# Patient Record
Sex: Female | Born: 1960 | Race: White | Hispanic: No | Smoking: Never smoker
Health system: Southern US, Community
[De-identification: ages and names within clinical notes are randomized; demographics above are authoritative.]

## PROBLEM LIST (undated history)

## (undated) DIAGNOSIS — F419 Anxiety disorder, unspecified: Secondary | ICD-10-CM

## (undated) DIAGNOSIS — F319 Bipolar disorder, unspecified: Secondary | ICD-10-CM

## (undated) DIAGNOSIS — M5432 Sciatica, left side: Secondary | ICD-10-CM

## (undated) HISTORY — PX: APPENDECTOMY: SHX54

## (undated) HISTORY — PX: WRIST FRACTURE SURGERY: SHX121

## (undated) HISTORY — PX: COLONOSCOPY: SHX174

## (undated) HISTORY — PX: TONSILLECTOMY: SUR1361

## (undated) HISTORY — DX: Anxiety disorder, unspecified: F41.9

## (undated) HISTORY — DX: Sciatica, left side: M54.32

## (undated) HISTORY — PX: HERNIA REPAIR: SHX51

---

## 1991-01-16 HISTORY — PX: TMJ ARTHROPLASTY: SHX1066

## 2006-01-15 HISTORY — PX: ABDOMINAL HYSTERECTOMY: SHX81

## 2014-01-12 ENCOUNTER — Ambulatory Visit: Payer: Self-pay | Admitting: Emergency Medicine

## 2014-01-12 LAB — RAPID STREP-A WITH REFLX: Micro Text Report: NEGATIVE

## 2014-01-15 LAB — BETA STREP CULTURE(ARMC)

## 2015-06-29 LAB — HM DEXA SCAN: HM DEXA SCAN: NORMAL

## 2015-08-02 LAB — COLOGUARD

## 2016-02-17 ENCOUNTER — Encounter: Payer: Self-pay | Admitting: Internal Medicine

## 2016-02-22 ENCOUNTER — Ambulatory Visit: Payer: Self-pay | Admitting: Internal Medicine

## 2016-08-06 ENCOUNTER — Encounter: Payer: Self-pay | Admitting: Emergency Medicine

## 2016-08-06 ENCOUNTER — Ambulatory Visit
Admission: EM | Admit: 2016-08-06 | Discharge: 2016-08-06 | Disposition: A | Discharge status: Home / Self Care | Payer: 59 | Attending: Family Medicine | Admitting: Family Medicine

## 2016-08-06 DIAGNOSIS — J0100 Acute maxillary sinusitis, unspecified: Secondary | ICD-10-CM | POA: Diagnosis not present

## 2016-08-06 DIAGNOSIS — R05 Cough: Secondary | ICD-10-CM

## 2016-08-06 DIAGNOSIS — R059 Cough, unspecified: Secondary | ICD-10-CM

## 2016-08-06 HISTORY — DX: Bipolar disorder, unspecified: F31.9

## 2016-08-06 MED ORDER — BENZONATATE 100 MG PO CAPS: 100.0000 mg | ORAL_CAPSULE | Freq: Three times a day (TID) | ORAL | 0 refills | Status: DC | PRN

## 2016-08-06 MED ORDER — AMOXICILLIN 875 MG PO TABS: 875.0000 mg | ORAL_TABLET | Freq: Two times a day (BID) | ORAL | 0 refills | Status: DC

## 2016-08-06 NOTE — ED Provider Notes (Signed)
[facility]    CSN: [id number] Arrival date & time: 08/06/16  1158     History   Chief Complaint Chief Complaint  Patient presents with  . Sinus Problem    HPI Jenna Estrada is a 56 y.o. female.   The history is provided by the patient.  Sinus Problem   URI  Presenting symptoms: congestion, facial pain and fatigue   Severity:  Moderate Onset quality:  Sudden Timing:  Constant Progression:  Worsening Chronicity:  New Relieved by:  Nothing Ineffective treatments:  OTC medications Associated symptoms: sinus pain   Risk factors: not elderly, no chronic cardiac disease, no chronic kidney disease, no chronic respiratory disease, no diabetes mellitus, no immunosuppression, no recent illness, no recent travel and no sick contacts     Past Medical History:  Diagnosis Date  . Bipolar 1 disorder (HCC)     There are no active problems to display for this patient.   Past Surgical History:  Procedure Laterality Date  . ABDOMINAL HYSTERECTOMY    . APPENDECTOMY    . HERNIA REPAIR    . TONSILLECTOMY      OB History    No data available       Home Medications    Prior to Admission medications   Medication Sig Start Date End Date Taking? Authorizing Provider  ARIPiprazole (ABILIFY) 2 MG tablet Take 2 mg by mouth daily.   Yes [provider]  buPROPion (WELLBUTRIN XL) 300 MG 24 hr tablet Take 300 mg by mouth daily.   Yes [provider]  DULoxetine (CYMBALTA) 60 MG capsule Take 60 mg by mouth daily.   Yes [provider]  LamoTRIgine 100 MG TBDP Take 200 mg by mouth daily.   Yes [provider]  amoxicillin (AMOXIL) 875 MG tablet Take 1 tablet (875 mg total) by mouth 2 (two) times daily. 08/06/16   Payton Mccallum, MD  benzonatate (TESSALON) 100 MG capsule Take 1 capsule (100 mg total) by mouth 3 (three) times daily as needed. 08/06/16   Payton Mccallum, MD    Family History History reviewed. No pertinent family  history.  Social History Social History  Substance Use Topics  . Smoking status: Never Smoker  . Smokeless tobacco: Never Used  . Alcohol use No     Allergies   Patient has no known allergies.   Review of Systems Review of Systems  Constitutional: Positive for fatigue.  HENT: Positive for congestion and sinus pain.      Physical Exam Triage Vital Signs ED Triage Vitals  Enc Vitals Group     BP 08/06/16 1215 134/85     Pulse Rate 08/06/16 1215 84     Resp 08/06/16 1215 16     Temp 08/06/16 1215 98.2 F (36.8 C)     Temp Source 08/06/16 1215 Oral     SpO2 08/06/16 1215 99 %     Weight 08/06/16 1214 151 lb (68.5 kg)     Height 08/06/16 1214 5\' 6"  (1.676 m)     Head Circumference --      Peak Flow --      Pain Score 08/06/16 1215 0     Pain Loc --      Pain Edu? --      Excl. in GC? --    No data found.   Updated Vital Signs BP 134/85 (BP Location: Left Arm)   Pulse 84   Temp 98.2 F (36.8 C) (Oral)   Resp  16   Ht 5\' 6"  (1.676 m)   Wt 151 lb (68.5 kg)   SpO2 99%   BMI 24.37 kg/m   Visual Acuity Right Eye Distance:   Left Eye Distance:   Bilateral Distance:    Right Eye Near:   Left Eye Near:    Bilateral Near:     Physical Exam  Constitutional: She appears well-developed and well-nourished. No distress.  HENT:  Head: Normocephalic and atraumatic.  Right Ear: Tympanic membrane, external ear and ear canal normal.  Left Ear: Tympanic membrane, external ear and ear canal normal.  Nose: Mucosal edema and rhinorrhea present. No nose lacerations, sinus tenderness, nasal deformity, septal deviation or nasal septal hematoma. No epistaxis.  No foreign bodies. Right sinus exhibits maxillary sinus tenderness and frontal sinus tenderness. Left sinus exhibits maxillary sinus tenderness and frontal sinus tenderness.  Mouth/Throat: Uvula is midline, oropharynx is clear and moist and mucous membranes are normal. No oropharyngeal exudate.  Eyes: Pupils are equal,  round, and reactive to light. Conjunctivae and EOM are normal. Right eye exhibits no discharge. Left eye exhibits no discharge. No scleral icterus.  Neck: Normal range of motion. Neck supple. No thyromegaly present.  Cardiovascular: Normal rate, regular rhythm and normal heart sounds.   Pulmonary/Chest: Effort normal and breath sounds normal. No respiratory distress. She has no wheezes. She has no rales.  Lymphadenopathy:    She has no cervical adenopathy.  Skin: She is not diaphoretic.  Nursing note and vitals reviewed.    UC Treatments / Results  Labs (all labs ordered are listed, but only abnormal results are displayed) Labs Reviewed - No data to display  EKG  EKG Interpretation None       Radiology No results found.  Procedures Procedures (including critical care time)  Medications Ordered in UC Medications - No data to display   Initial Impression / Assessment and Plan / UC Course  I have reviewed the triage vital signs and the nursing notes.  Pertinent labs & imaging results that were available during my care of the patient were reviewed by me and considered in my medical decision making (see chart for details).       Final Clinical Impressions(s) / UC Diagnoses   Final diagnoses:  Acute maxillary sinusitis, recurrence not specified  Cough    New Prescriptions New Prescriptions   AMOXICILLIN (AMOXIL) 875 MG TABLET    Take 1 tablet (875 mg total) by mouth 2 (two) times daily.   BENZONATATE (TESSALON) 100 MG CAPSULE    Take 1 capsule (100 mg total) by mouth 3 (three) times daily as needed.   1. diagnosis reviewed with patient 2. rx as per orders above; reviewed possible side effects, interactions, risks and benefits  3. Recommend supportive treatment with fluids, flonase 4. Follow-up prn if symptoms worsen or don't improve   Payton Mccallumonty, Portia Wisdom, MD 08/06/16 1242

## 2016-08-06 NOTE — ED Triage Notes (Signed)
[facility] [place] [facility] [place] [facility] [place].  Patient states that they did not give her antibiotic.  Patient reports that her symptoms have not improved.  Patient denies fevers.

## 2016-08-13 ENCOUNTER — Ambulatory Visit
Admission: EM | Admit: 2016-08-13 | Discharge: 2016-08-13 | Disposition: A | Discharge status: Home / Self Care | Payer: 59 | Attending: Family Medicine | Admitting: Family Medicine

## 2016-08-13 DIAGNOSIS — J0100 Acute maxillary sinusitis, unspecified: Secondary | ICD-10-CM | POA: Diagnosis not present

## 2016-08-13 DIAGNOSIS — R059 Cough, unspecified: Secondary | ICD-10-CM

## 2016-08-13 DIAGNOSIS — R05 Cough: Secondary | ICD-10-CM

## 2016-08-13 MED ORDER — HYDROCOD POLST-CPM POLST ER 10-8 MG/5ML PO SUER
5.0000 mL | Freq: Every evening | ORAL | 0 refills | Status: DC | PRN
Start: 2016-08-13 — End: 2016-09-26

## 2016-08-13 MED ORDER — BENZONATATE 200 MG PO CAPS: 200.0000 mg | ORAL_CAPSULE | Freq: Three times a day (TID) | ORAL | 0 refills | Status: DC | PRN

## 2016-08-13 NOTE — ED Provider Notes (Signed)
[facility] ____________________________________________  Time seen: Approximately 11:44 AM  I have reviewed the triage vital signs and the nursing notes.   HISTORY  Chief Complaint Cough   HPI Jenna Estrada is a 56 y.o. female  present for reevaluation of recent sinusitis and cough. Patient reports she was seen 1 week ago was treated with amoxicillin and Tessalon Perles as needed for cough and sinusitis. Patient denies fevers. States cough is worse at night with associated postnasal drainage. Reports first thing in the morning cough is somewhat productive of whitish mucus, reports otherwise cough is just a dry nonproductive cough. Reports continues his mucus when blowing nose but reports mucus has changed from green to white over the last several days while taking the antibiotic. States she does still has some maxillary sinus discomfort, but it has improved some. Reports 2 more days of antibiotic left. Denies associated ear complaints, sore throat, chest pain, shortness breath, wheezing or fevers. Reports has continued to remain active. Reports continues to eat and drink well. States overall she feels like the antibiotic has been working at improving her symptoms, but reports she still feels tired as she is not sleeping much with the cough. States that she has to go out of town tomorrow as her mother was recently placed on hospice. No over the counter medications taken. Denies aggravating or alleviating factors.   Denies chest pain, shortness of breath, hemoptysis, abdominal pain, dysuria, extremity pain, extremity swelling or rash. Denies other recent sickness. Denies cardiac history. Denies renal insufficiency.  Reubin MilanBerglund, Laura H, MD: PCP   Past Medical History:  Diagnosis Date  . Bipolar 1 disorder (HCC)     There are no active problems to display for this patient.   Past Surgical History:  Procedure Laterality Date  . ABDOMINAL HYSTERECTOMY    . APPENDECTOMY     . HERNIA REPAIR    . TONSILLECTOMY       No current facility-administered medications for this encounter.   Current Outpatient Prescriptions:  .  ARIPiprazole (ABILIFY) 2 MG tablet, Take 2 mg by mouth daily., Disp: , Rfl:  .  buPROPion (WELLBUTRIN XL) 300 MG 24 hr tablet, Take 300 mg by mouth daily., Disp: , Rfl:  .  DULoxetine (CYMBALTA) 60 MG capsule, Take 60 mg by mouth daily., Disp: , Rfl:  .  LamoTRIgine 100 MG TBDP, Take 200 mg by mouth daily., Disp: , Rfl:  .  amoxicillin (AMOXIL) 875 MG tablet, Take 1 tablet (875 mg total) by mouth 2 (two) times daily., Disp: 20 tablet, Rfl: 0 .  benzonatate (TESSALON) 200 MG capsule, Take 1 capsule (200 mg total) by mouth 3 (three) times daily as needed for cough., Disp: 20 capsule, Rfl: 0 .  chlorpheniramine-HYDROcodone (TUSSIONEX PENNKINETIC ER) 10-8 MG/5ML SUER, Take 5 mLs by mouth at bedtime as needed. do not drive or operate machinery while taking as can cause drowsiness., Disp: 75 mL, Rfl: 0  Allergies Patient has no known allergies.  History reviewed. No pertinent family history.  Social History Social History  Substance Use Topics  . Smoking status: Never Smoker  . Smokeless tobacco: Never Used  . Alcohol use No    Review of Systems Constitutional: No fever/chills ENT: No sore throat.  Cardiovascular: Denies chest pain. Respiratory: Denies shortness of breath. Gastrointestinal: No abdominal pain.  Genitourinary: Negative for dysuria. Musculoskeletal: Negative for back pain. Skin: Negative for rash.  ____________________________________________   PHYSICAL EXAM:  VITAL SIGNS: ED Triage Vitals  Enc Vitals Group  BP 08/13/16 1055 137/78     Pulse Rate 08/13/16 1055 79     Resp 08/13/16 1055 17     Temp 08/13/16 1055 98.4 F (36.9 C)     Temp Source 08/13/16 1055 Oral     SpO2 08/13/16 1055 98 %     Weight 08/13/16 1056 151 lb (68.5 kg)     Height 08/13/16 1056 5\' 6"  (1.676 m)     Head Circumference --       Peak Flow --      Pain Score 08/13/16 1056 5     Pain Loc --      Pain Edu? --      Excl. in GC? --     Constitutional: Alert and oriented. Well appearing and in no acute distress. Eyes: Conjunctivae are normal.  Head: Atraumatic.Mild tenderness to palpation bilateral frontal and maxillary sinuses. No swelling. No erythema.   Ears: no erythema, normal TMs bilaterally.   Nose: no nasal drainage or mucosal edema  Mouth/Throat: Mucous membranes are moist.  Oropharynx non-erythematous.No tonsillar swelling or exudate.  Neck: No stridor.  No cervical spine tenderness to palpation. Hematological/Lymphatic/Immunilogical: No cervical lymphadenopathy. Cardiovascular: Normal rate, regular rhythm. Grossly normal heart sounds.  Good peripheral circulation. Respiratory: Normal respiratory effort.  No retractions. No wheezes, rales or rhonchi. Good air movement.  Musculoskeletal:steady gait. No cervical, thoracic or lumbar tenderness to palpation.  Neurologic:  Normal speech and language. No gait instability. Skin:  Skin is warm, dry. Psychiatric: Mood and affect are normal. Speech and behavior are normal.  ___________________________________________   LABS (all labs ordered are listed, but only abnormal results are displayed)  Labs Reviewed - No data to display ____________________________________________   PROCEDURES Procedures   INITIAL IMPRESSION / ASSESSMENT AND PLAN / ED COURSE  Pertinent labs & imaging results that were available during my care of the patient were reviewed by me and considered in my medical decision making (see chart for details).   Well appearing patient. No acute distress.Presenting for follow-up of recent sinusitis and cough. Patient still taking amoxicillin and has 2 days left. Suspect improving sinusitis, with continued cough. Will treat supportively with Tessalon Perles and Tussionex. Patient states she only had a few Tessalon Perles left, will refill as well as  increased dose. When necessary Tussionex. Encourage rest, fluids and supportive care and over-the-counter Sudafed as needed. Discussed indication, risks and benefits of medications with patient.  Discussed follow up with Primary care physician this week. Discussed follow up and return parameters including no resolution or any worsening concerns. Patient verbalized understanding and agreed to plan.   ____________________________________________   FINAL CLINICAL IMPRESSION(S) / ED DIAGNOSES  Final diagnoses:  Acute maxillary sinusitis, recurrence not specified  Cough     Discharge Medication List as of 08/13/2016 11:38 AM    START taking these medications   Details  chlorpheniramine-HYDROcodone (TUSSIONEX PENNKINETIC ER) 10-8 MG/5ML SUER Take 5 mLs by mouth at bedtime as needed. do not drive or operate machinery while taking as can cause drowsiness., Starting Mon 08/13/2016, Print      tessalon perles  Note: This dictation was prepared with Dragon dictation along with smaller phrase technology. Any transcriptional errors that result from this process are unintentional.         Renford DillsMiller, Rishan Oyama, NP 08/13/16 1159

## 2016-08-13 NOTE — ED Triage Notes (Signed)
Patient complains of cough, congestion, weakness. Patient states that she was seen on 08/06/2016 and was treated with amoxicillin and tessalon perles. Patient states that she does not feel like she has fully improved. Patient reports that she is going out of town for a week and would like to feel better.

## 2016-08-13 NOTE — Discharge Instructions (Signed)
Take medication as prescribed. Rest. Drink plenty of fluids.  ° °Follow up with your primary care physician this week as needed. Return to Urgent care for new or worsening concerns.  ° °

## 2016-09-26 ENCOUNTER — Ambulatory Visit
Admission: EM | Admit: 2016-09-26 | Discharge: 2016-09-26 | Disposition: A | Discharge status: Home / Self Care | Payer: 59 | Attending: Family Medicine | Admitting: Family Medicine

## 2016-09-26 DIAGNOSIS — R059 Cough, unspecified: Secondary | ICD-10-CM

## 2016-09-26 DIAGNOSIS — R05 Cough: Secondary | ICD-10-CM

## 2016-09-26 DIAGNOSIS — J0100 Acute maxillary sinusitis, unspecified: Secondary | ICD-10-CM

## 2016-09-26 MED ORDER — BENZONATATE 100 MG PO CAPS: 100.0000 mg | ORAL_CAPSULE | Freq: Three times a day (TID) | ORAL | 0 refills | Status: DC | PRN

## 2016-09-26 MED ORDER — AMOXICILLIN 875 MG PO TABS: 875.0000 mg | ORAL_TABLET | Freq: Two times a day (BID) | ORAL | 0 refills | Status: DC

## 2016-09-26 MED ORDER — HYDROCOD POLST-CPM POLST ER 10-8 MG/5ML PO SUER: 5.0000 mL | Freq: Every evening | ORAL | 0 refills | Status: DC | PRN

## 2016-09-26 NOTE — ED Provider Notes (Signed)
[facility]    CSN: [id number] Arrival date & time: 09/26/16  1038     History   Chief Complaint Chief Complaint  Patient presents with  . Nasal Congestion  . Cough  . Headache    HPI Jenna Estrada is a 56 y.o. female.   The history is provided by the patient.  Cough  Associated symptoms: headaches   Associated symptoms: no wheezing   Headache  Associated symptoms: congestion, cough, facial pain and URI   URI  Presenting symptoms: congestion, cough and facial pain   Severity:  Moderate Onset quality:  Sudden Duration:  1 week Timing:  Constant Progression:  Worsening Chronicity:  New Relieved by:  Nothing Ineffective treatments:  OTC medications Associated symptoms: headaches and sinus pain   Associated symptoms: no wheezing   Risk factors: not elderly, no chronic cardiac disease, no chronic kidney disease, no chronic respiratory disease, no diabetes mellitus, no immunosuppression, no recent illness, no recent travel and no sick contacts     Past Medical History:  Diagnosis Date  . Bipolar 1 disorder (HCC)     There are no active problems to display for this patient.   Past Surgical History:  Procedure Laterality Date  . ABDOMINAL HYSTERECTOMY    . APPENDECTOMY    . HERNIA REPAIR    . TONSILLECTOMY      OB History    No data available       Home Medications    Prior to Admission medications   Medication Sig Start Date End Date Taking? Authorizing Provider  amoxicillin (AMOXIL) 875 MG tablet Take 1 tablet (875 mg total) by mouth 2 (two) times daily. 09/26/16   Payton Mccallum, MD  ARIPiprazole (ABILIFY) 2 MG tablet Take 2 mg by mouth daily.    [provider]  benzonatate (TESSALON) 100 MG capsule Take 1 capsule (100 mg total) by mouth 3 (three) times daily as needed. 09/26/16   Payton Mccallum, MD  buPROPion (WELLBUTRIN XL) 300 MG 24 hr tablet Take 300 mg by mouth daily.    [provider]    chlorpheniramine-HYDROcodone (TUSSIONEX PENNKINETIC ER) 10-8 MG/5ML SUER Take 5 mLs by mouth at bedtime as needed. 09/26/16   Payton Mccallum, MD  DULoxetine (CYMBALTA) 60 MG capsule Take 60 mg by mouth daily.    [provider]  LamoTRIgine 100 MG TBDP Take 200 mg by mouth daily.    [provider]    Family History No family history on file.  Social History Social History  Substance Use Topics  . Smoking status: Never Smoker  . Smokeless tobacco: Never Used  . Alcohol use No     Allergies   Patient has no known allergies.   Review of Systems Review of Systems  HENT: Positive for congestion and sinus pain.   Respiratory: Positive for cough. Negative for wheezing.   Neurological: Positive for headaches.     Physical Exam Triage Vital Signs ED Triage Vitals  Enc Vitals Group     BP 09/26/16 1147 119/76     Pulse Rate 09/26/16 1147 79     Resp 09/26/16 1147 16     Temp 09/26/16 1147 98.1 F (36.7 C)     Temp Source 09/26/16 1147 Oral     SpO2 09/26/16 1147 100 %     Weight 09/26/16 1147 151 lb (68.5 kg)     Height 09/26/16 1147  (1.676 m)     Head Circumference --  Peak Flow --      Pain Score 09/26/16 1150 8     Pain Loc --      Pain Edu? --      Excl. in GC? --    No data found.   Updated Vital Signs BP 119/76 (BP Location: Left Arm)   Pulse 79   Temp 98.1 F (36.7 C) (Oral)   Resp 16   Ht 5\' 6"  (1.676 m)   Wt 151 lb (68.5 kg)   SpO2 100%   BMI 24.37 kg/m   Visual Acuity Right Eye Distance:   Left Eye Distance:   Bilateral Distance:    Right Eye Near:   Left Eye Near:    Bilateral Near:     Physical Exam  Constitutional: She appears well-developed and well-nourished. No distress.  HENT:  Head: Normocephalic and atraumatic.  Right Ear: Tympanic membrane, external ear and ear canal normal.  Left Ear: Tympanic membrane, external ear and ear canal normal.  Nose: Mucosal edema and rhinorrhea present. No nose  lacerations, sinus tenderness, nasal deformity, septal deviation or nasal septal hematoma. No epistaxis.  No foreign bodies. Right sinus exhibits maxillary sinus tenderness and frontal sinus tenderness. Left sinus exhibits maxillary sinus tenderness and frontal sinus tenderness.  Mouth/Throat: Uvula is midline, oropharynx is clear and moist and mucous membranes are normal. No oropharyngeal exudate.  Eyes: Pupils are equal, round, and reactive to light. Conjunctivae and EOM are normal. Right eye exhibits no discharge. Left eye exhibits no discharge. No scleral icterus.  Neck: Normal range of motion. Neck supple. No thyromegaly present.  Cardiovascular: Normal rate, regular rhythm and normal heart sounds.   Pulmonary/Chest: Effort normal and breath sounds normal. No respiratory distress. She has no wheezes. She has no rales.  Lymphadenopathy:    She has no cervical adenopathy.  Skin: She is not diaphoretic.  Nursing note and vitals reviewed.    UC Treatments / Results  Labs (all labs ordered are listed, but only abnormal results are displayed) Labs Reviewed - No data to display  EKG  EKG Interpretation None       Radiology No results found.  Procedures Procedures (including critical care time)  Medications Ordered in UC Medications - No data to display   Initial Impression / Assessment and Plan / UC Course  I have reviewed the triage vital signs and the nursing notes.  Pertinent labs & imaging results that were available during my care of the patient were reviewed by me and considered in my medical decision making (see chart for details).       Final Clinical Impressions(s) / UC Diagnoses   Final diagnoses:  Acute maxillary sinusitis, recurrence not specified  Cough    New Prescriptions Discharge Medication List as of 09/26/2016 12:57 PM     1. diagnosis reviewed with patient 2. rx as per orders above; reviewed possible side effects, interactions, risks and  benefits  3. Recommend supportive treatment with otc flonase 4. Follow-up prn if symptoms worsen or don't improve  Controlled Substance Prescriptions [place] Controlled Substance Registry consulted? Not Applicable   Payton Mccallumonty, Waymond Meador, MD 09/26/16 1328

## 2016-09-26 NOTE — ED Triage Notes (Signed)
PT reports nasal congestion, cough, headache, facial pressure from sinuses x 3 days. Pt reports hx of sinus infection several months ago and this feels the same.

## 2016-10-30 ENCOUNTER — Ambulatory Visit: Payer: Self-pay | Admitting: Internal Medicine

## 2016-10-31 ENCOUNTER — Other Ambulatory Visit: Payer: Self-pay | Admitting: Internal Medicine

## 2016-10-31 ENCOUNTER — Encounter: Payer: Self-pay | Admitting: Internal Medicine

## 2016-10-31 ENCOUNTER — Ambulatory Visit (INDEPENDENT_AMBULATORY_CARE_PROVIDER_SITE_OTHER): Payer: 59 | Admitting: Internal Medicine

## 2016-10-31 VITALS — BP 116/70 | HR 69 | Ht 66.0 in | Wt 165.0 lb

## 2016-10-31 DIAGNOSIS — F319 Bipolar disorder, unspecified: Secondary | ICD-10-CM | POA: Diagnosis not present

## 2016-10-31 DIAGNOSIS — R05 Cough: Secondary | ICD-10-CM | POA: Insufficient documentation

## 2016-10-31 DIAGNOSIS — Z1239 Encounter for other screening for malignant neoplasm of breast: Secondary | ICD-10-CM

## 2016-10-31 DIAGNOSIS — R058 Other specified cough: Secondary | ICD-10-CM | POA: Insufficient documentation

## 2016-10-31 DIAGNOSIS — M5440 Lumbago with sciatica, unspecified side: Secondary | ICD-10-CM | POA: Diagnosis not present

## 2016-10-31 DIAGNOSIS — Z23 Encounter for immunization: Secondary | ICD-10-CM

## 2016-10-31 DIAGNOSIS — Z86010 Personal history of colonic polyps: Secondary | ICD-10-CM

## 2016-10-31 DIAGNOSIS — Z1231 Encounter for screening mammogram for malignant neoplasm of breast: Secondary | ICD-10-CM

## 2016-10-31 DIAGNOSIS — G8929 Other chronic pain: Secondary | ICD-10-CM

## 2016-10-31 MED ORDER — GABAPENTIN 300 MG PO CAPS: 300.0000 mg | ORAL_CAPSULE | Freq: Two times a day (BID) | ORAL | 5 refills | Status: DC

## 2016-10-31 NOTE — Patient Instructions (Signed)
Non sedating antihistamine such as Allegra, Claritin or Zyrtec every evening for 1-2 weeks to help reduce cough.  Can continue indefinitely if it is helpful.

## 2016-10-31 NOTE — Progress Notes (Signed)
Date:  10/31/2016   Name:  Jenna Estrada   DOB:  11/11/1960   MRN:  [id number]   Chief Complaint: Establish Care (Moved here from [place] in december of last year. See's Gene [facility] at [facility] for most medication. ); Sciatica (Gabpentin refill. ); and Cough (X 4 months. When father was dx withed rectal cancer his first sign was cough. Wants chest xray to be sure everything is okay. Last lab work was in May and that was fine. )  Cough  This is a chronic problem. The current episode started more than 1 month ago (4 months). The problem has been unchanged. The cough is non-productive. Associated symptoms include postnasal drip. Pertinent negatives include no chest pain, chills, headaches, sore throat, shortness of breath or wheezing. There is no history of environmental allergies.  Back Pain  This is a chronic problem. The problem is unchanged. The pain is present in the lumbar spine. The pain radiates to the left foot. Pertinent negatives include no abdominal pain, chest pain or headaches. She has tried home exercises (gabapentin) for the symptoms. The treatment provided significant relief.   Bipolar I - doing well on current medications and followed by Psych.  No recent issues or problems.  No medication side effects noted.    Review of Systems  Constitutional: Negative for chills, fatigue and unexpected weight change.  HENT: Positive for postnasal drip. Negative for congestion, sore throat and trouble swallowing.   Eyes: Negative for visual disturbance.  Respiratory: Positive for cough. Negative for chest tightness, shortness of breath and wheezing.   Cardiovascular: Negative for chest pain, palpitations and leg swelling.  Gastrointestinal: Negative for abdominal pain, constipation and diarrhea.  Musculoskeletal: Positive for back pain.  Allergic/Immunologic: Negative for environmental allergies and food allergies.  Neurological: Negative for dizziness and headaches.    Psychiatric/Behavioral: Negative for dysphoric mood. The patient is not nervous/anxious.     There are no active problems to display for this patient.   Prior to Admission medications   Medication Sig Start Date End Date Taking? Authorizing Provider  ARIPiprazole (ABILIFY) 2 MG tablet Take 2.5 mg by mouth daily.    Yes [provider]  buPROPion (WELLBUTRIN XL) 300 MG 24 hr tablet Take 300 mg by mouth daily.   Yes [provider]  DULoxetine (CYMBALTA) 60 MG capsule Take 60 mg by mouth daily.   Yes [provider]  gabapentin (NEURONTIN) 300 MG capsule Take 300 mg by mouth 2 (two) times daily. 09/21/16  Yes [provider]  LamoTRIgine 100 MG TBDP Take 200 mg by mouth daily.   Yes [provider]    No Known Allergies  Past Surgical History:  Procedure Laterality Date  . ABDOMINAL HYSTERECTOMY     still have ovaries  . APPENDECTOMY     1971  . COLONOSCOPY     Every 5 years.   Marland Kitchen. HERNIA REPAIR    . 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 TONSILLECTOMY    . WRIST FRACTURE SURGERY     plate put in right wrist     Social History  Substance Use Topics  . Smoking status: Never Smoker  . Smokeless tobacco: Never Used  . Alcohol use No     Medication list has been reviewed and updated.  PHQ 2/9 Scores 10/31/2016  PHQ - 2 Score 1    Physical Exam  Constitutional: She is oriented to person, place, and time. She appears  well-developed. No distress.  HENT:  Head: Normocephalic and atraumatic.  Right Ear: Tympanic membrane and ear canal normal.  Left Ear: Tympanic membrane and ear canal normal.  Nose: Right sinus exhibits no maxillary sinus tenderness. Left sinus exhibits no maxillary sinus tenderness.  Mouth/Throat: No oropharyngeal exudate or posterior oropharyngeal erythema.  Neck: Normal range of motion. Neck supple. No thyromegaly present.  Cardiovascular: Normal rate, regular rhythm and normal heart sounds.    Pulmonary/Chest: Effort normal and breath sounds normal. No respiratory distress. She has no wheezes. She has no rales.  Abdominal: Soft. Normal appearance and bowel sounds are normal. There is no tenderness. There is no CVA tenderness.  Musculoskeletal: Normal range of motion.  Lymphadenopathy:       Head (right side): Submandibular (small lymph node) adenopathy present.  Neurological: She is alert and oriented to person, place, and time.  Skin: Skin is warm and dry. No rash noted.  Psychiatric: She has a normal mood and affect. Her speech is normal and behavior is normal. Thought content normal.  Nursing note and vitals reviewed.   BP 116/70   Pulse 69   Ht 5\' 6"  (1.676 m)   Wt 165 lb (74.8 kg)   SpO2 99%   BMI 26.63 kg/m   Assessment and Plan: 1. Bipolar 1 disorder (HCC) Stable on current medications Followed by Psych  2. Chronic low back pain with sciatica, sciatica laterality unspecified, unspecified back pain laterality Doing well with gabapentin for mild low back pain - gabapentin (NEURONTIN) 300 MG capsule; Take 1 capsule (300 mg total) by mouth 2 (two) times daily.  Dispense: 60 capsule; Refill: 5  3. Allergic cough Trial of antihistamine recommended  4. Breast cancer screening - MM DIGITAL SCREENING BILATERAL; Future  5. Need for influenza vaccination - Flu Vaccine QUAD 36+ mos IM   Meds ordered this encounter  Medications  . gabapentin (NEURONTIN) 300 MG capsule    Sig: Take 1 capsule (300 mg total) by mouth 2 (two) times daily.    Dispense:  60 capsule    Refill:  5    Partially dictated using Animal nutritionist. Any errors are unintentional. Will request records from previous MD to update records here  Bari Edward, MD [facility]  10/31/2016

## 2016-11-05 ENCOUNTER — Encounter: Payer: Self-pay | Admitting: Internal Medicine

## 2016-11-05 ENCOUNTER — Telehealth: Payer: Self-pay | Admitting: Internal Medicine

## 2016-11-05 NOTE — Telephone Encounter (Signed)
Please call patient to let her know that I have the colonoscopy report from 06/2015.  They found 2 polyps but the path report was empty of polyp type. The doctor recommended in his note to do a Cologuard since the prep was poor.  Ask her if she did, indeed, do a cologuard test last year. He recommended a repeat colonoscopy in 3 years (2020) if Cologuard was negative.

## 2016-11-06 NOTE — Telephone Encounter (Signed)
Patient stated she had Cologuard test and it turned out normal/negative. Will get previous doctor to send report to our office in FAX.

## 2016-11-12 ENCOUNTER — Inpatient Hospital Stay
Admission: RE | Admit: 2016-11-12 | Discharge: 2016-11-12 | Disposition: A | Discharge status: Home / Self Care | Payer: Self-pay | Source: Ambulatory Visit | Attending: *Deleted | Admitting: *Deleted

## 2016-11-12 ENCOUNTER — Other Ambulatory Visit: Payer: Self-pay | Admitting: *Deleted

## 2016-11-12 DIAGNOSIS — Z9289 Personal history of other medical treatment: Secondary | ICD-10-CM

## 2016-11-28 ENCOUNTER — Ambulatory Visit: Payer: 59

## 2016-12-05 ENCOUNTER — Ambulatory Visit: Payer: 59

## 2016-12-18 ENCOUNTER — Ambulatory Visit
Admission: RE | Admit: 2016-12-18 | Discharge: 2016-12-18 | Disposition: A | Discharge status: Home / Self Care | Payer: 59 | Source: Ambulatory Visit | Attending: Internal Medicine | Admitting: Internal Medicine

## 2016-12-18 DIAGNOSIS — Z1231 Encounter for screening mammogram for malignant neoplasm of breast: Secondary | ICD-10-CM | POA: Diagnosis present

## 2016-12-18 DIAGNOSIS — Z1239 Encounter for other screening for malignant neoplasm of breast: Secondary | ICD-10-CM

## 2016-12-21 ENCOUNTER — Ambulatory Visit
Admission: EM | Admit: 2016-12-21 | Discharge: 2016-12-21 | Disposition: A | Discharge status: Home / Self Care | Payer: 59 | Attending: Family Medicine | Admitting: Family Medicine

## 2016-12-21 ENCOUNTER — Encounter: Payer: Self-pay | Admitting: Emergency Medicine

## 2016-12-21 ENCOUNTER — Other Ambulatory Visit: Payer: Self-pay

## 2016-12-21 DIAGNOSIS — J0100 Acute maxillary sinusitis, unspecified: Secondary | ICD-10-CM | POA: Diagnosis not present

## 2016-12-21 MED ORDER — AMOXICILLIN 875 MG PO TABS: 875.0000 mg | ORAL_TABLET | Freq: Two times a day (BID) | ORAL | 0 refills | Status: DC

## 2016-12-21 MED ORDER — GUAIFENESIN-CODEINE 100-10 MG/5ML PO SOLN: ORAL | 0 refills | Status: DC

## 2016-12-21 NOTE — ED Triage Notes (Signed)
Patient c/o sinus pressure and congestion a week ago.  Patient denies fevers.

## 2016-12-21 NOTE — ED Provider Notes (Signed)
[facility]    CSN: [id number] Arrival date & time: 12/21/16  1037     History   Chief Complaint Chief Complaint  Patient presents with  . Sinus Problem    HPI Jenna Estrada is a 56 y.o. female.   The history is provided by the patient.  Sinus Problem  Associated symptoms include headaches.  URI  Presenting symptoms: congestion and facial pain   Severity:  Moderate Onset quality:  Sudden Duration:  1 week Timing:  Constant Progression:  Worsening Chronicity:  New Relieved by:  Nothing Ineffective treatments:  OTC medications Associated symptoms: headaches and sinus pain   Associated symptoms: no wheezing   Risk factors: sick contacts   Risk factors: not elderly, no chronic cardiac disease, no chronic kidney disease, no chronic respiratory disease, no diabetes mellitus, no immunosuppression, no recent illness and no recent travel     Past Medical History:  Diagnosis Date  . Anxiety   . Bipolar 1 disorder (HCC)   . Sciatic nerve pain, left    taking gabapentin.    Patient Active Problem List   Diagnosis Date Noted  . Bipolar 1 disorder (HCC) 10/31/2016  . Chronic low back pain with sciatica 10/31/2016  . Allergic cough 10/31/2016  . Hx of colonic polyps 10/31/2016    Past Surgical History:  Procedure Laterality Date  . ABDOMINAL HYSTERECTOMY  2008   fibroids, ovaries remain  . APPENDECTOMY     1971  . COLONOSCOPY  10/2014 and 06/17/2015   Every 3 yr due to incomplete prep  . HERNIA REPAIR    . 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 TONSILLECTOMY    . WRIST FRACTURE SURGERY     plate put in right wrist     OB History    No data available       Home Medications    Prior to Admission medications   Medication Sig Start Date End Date Taking? Authorizing Provider  ARIPiprazole (ABILIFY) 2 MG tablet Take 2.5 mg by mouth daily.    Yes [provider]  buPROPion (WELLBUTRIN XL) 300 MG 24 hr tablet Take 300 mg by  mouth daily.   Yes [provider]  DULoxetine (CYMBALTA) 60 MG capsule Take 60 mg by mouth daily.   Yes [provider]  gabapentin (NEURONTIN) 300 MG capsule Take 1 capsule (300 mg total) by mouth 2 (two) times daily. 10/31/16  Yes Reubin MilanBerglund, Laura H, MD  LamoTRIgine 100 MG TBDP Take 200 mg by mouth daily.   Yes [provider]  amoxicillin (AMOXIL) 875 MG tablet Take 1 tablet (875 mg total) by mouth 2 (two) times daily. 12/21/16   Payton Mccallumonty, Neiko Trivedi, MD  guaiFENesin-codeine 100-10 MG/5ML syrup 10 ml po qhs prn cough 12/21/16   Payton Mccallumonty, Jackelynn Hosie, MD    Family History Family History  Problem Relation Age of Onset  . Hypertension Mother   . Rectal cancer Father   . COPD Father   . Asthma Father   . Breast cancer Neg Hx     Social History Social History   Tobacco Use  . Smoking status: Never Smoker  . Smokeless tobacco: Never Used  Substance Use Topics  . Alcohol use: No  . Drug use: No     Allergies   Patient has no known allergies.   Review of Systems Review of Systems  HENT: Positive for congestion and sinus pain.   Respiratory: Negative for wheezing.   Neurological: Positive  for headaches.     Physical Exam Triage Vital Signs ED Triage Vitals  Enc Vitals Group     BP 12/21/16 1051 (!) 147/88     Pulse Rate 12/21/16 1051 80     Resp 12/21/16 1051 16     Temp 12/21/16 1051 98.1 F (36.7 C)     Temp Source 12/21/16 1051 Oral     SpO2 12/21/16 1051 99 %     Weight 12/21/16 1050 160 lb (72.6 kg)     Height 12/21/16 1050 5\' 6"  (1.676 m)     Head Circumference --      Peak Flow --      Pain Score 12/21/16 1050 4     Pain Loc --      Pain Edu? --      Excl. in GC? --    No data found.  Updated Vital Signs BP (!) 147/88 (BP Location: Left Arm)   Pulse 80   Temp 98.1 F (36.7 C) (Oral)   Resp 16   Ht 5\' 6"  (1.676 m)   Wt 160 lb (72.6 kg)   SpO2 99%   BMI 25.82 kg/m   Visual Acuity Right Eye Distance:   Left Eye Distance:     Bilateral Distance:    Right Eye Near:   Left Eye Near:    Bilateral Near:     Physical Exam  Constitutional: She appears well-developed and well-nourished. No distress.  HENT:  Head: Normocephalic and atraumatic.  Right Ear: Tympanic membrane, external ear and ear canal normal.  Left Ear: Tympanic membrane, external ear and ear canal normal.  Nose: Mucosal edema and rhinorrhea present. No nose lacerations, sinus tenderness, nasal deformity, septal deviation or nasal septal hematoma. No epistaxis.  No foreign bodies. Right sinus exhibits maxillary sinus tenderness and frontal sinus tenderness. Left sinus exhibits maxillary sinus tenderness and frontal sinus tenderness.  Mouth/Throat: Uvula is midline, oropharynx is clear and moist and mucous membranes are normal. No oropharyngeal exudate.  Eyes: Conjunctivae and EOM are normal. Pupils are equal, round, and reactive to light. Right eye exhibits no discharge. Left eye exhibits no discharge. No scleral icterus.  Neck: Normal range of motion. Neck supple. No thyromegaly present.  Cardiovascular: Normal rate, regular rhythm and normal heart sounds.  Pulmonary/Chest: Effort normal and breath sounds normal. No respiratory distress. She has no wheezes. She has no rales.  Lymphadenopathy:    She has no cervical adenopathy.  Skin: She is not diaphoretic.  Nursing note and vitals reviewed.    UC Treatments / Results  Labs (all labs ordered are listed, but only abnormal results are displayed) Labs Reviewed - No data to display  EKG  EKG Interpretation None       Radiology No results found.  Procedures Procedures (including critical care time)  Medications Ordered in UC Medications - No data to display   Initial Impression / Assessment and Plan / UC Course  I have reviewed the triage vital signs and the nursing notes.  Pertinent labs & imaging results that were available during my care of the patient were reviewed by me and  considered in my medical decision making (see chart for details).       Final Clinical Impressions(s) / UC Diagnoses   Final diagnoses:  Acute maxillary sinusitis, recurrence not specified    ED Discharge Orders        Ordered    guaiFENesin-codeine 100-10 MG/5ML syrup     12/21/16 1113  amoxicillin (AMOXIL) 875 MG tablet  2 times daily     12/21/16 1113     1. Labs/x-ray results and diagnosis reviewed with patient 2. rx as per orders above; reviewed possible side effects, interactions, risks and benefits  3. Recommend supportive treatment with rest, otc flonase 4. Follow-up prn if symptoms worsen or don't improve   Controlled Substance Prescriptions [place] Controlled Substance Registry consulted? Not Applicable   Payton Mccallum, MD 12/21/16 1124

## 2016-12-25 ENCOUNTER — Other Ambulatory Visit: Payer: Self-pay

## 2016-12-25 ENCOUNTER — Encounter: Payer: Self-pay | Admitting: Emergency Medicine

## 2016-12-25 ENCOUNTER — Ambulatory Visit
Admission: EM | Admit: 2016-12-25 | Discharge: 2016-12-25 | Disposition: A | Discharge status: Home / Self Care | Payer: 59 | Attending: Family Medicine | Admitting: Family Medicine

## 2016-12-25 DIAGNOSIS — R05 Cough: Secondary | ICD-10-CM | POA: Diagnosis not present

## 2016-12-25 DIAGNOSIS — R059 Cough, unspecified: Secondary | ICD-10-CM

## 2016-12-25 MED ORDER — HYDROCOD POLST-CPM POLST ER 10-8 MG/5ML PO SUER: 5.0000 mL | Freq: Every evening | ORAL | 0 refills | Status: DC | PRN

## 2016-12-25 NOTE — ED Triage Notes (Signed)
Patient was seen 12/21/16 for cough and was prescribed cough medicine that is not working. Patient has used Tussinex in the past and it has work [employer].

## 2016-12-25 NOTE — ED Provider Notes (Signed)
[facility]    CSN: [id number] Arrival date & time: 12/25/16  1745  History   Chief Complaint Chief Complaint  Patient presents with  . Cough   HPI  56 year old female presents with the above complaint.  Patient was recently seen on 12/7.  She was diagnosed with sinusitis and was given an antibiotic and cough medication.  She states that the cough medication is not working.  She still has severe cough at night.  Interfering with sleep.  No shortness of breath.  No fever.  She states that she is otherwise feeling well.  She is requesting an alternative medication for cough.  She is requesting Tussionex.  Past Medical History:  Diagnosis Date  . Anxiety   . Bipolar 1 disorder (HCC)   . Sciatic nerve pain, left    taking gabapentin.    Patient Active Problem List   Diagnosis Date Noted  . Bipolar 1 disorder (HCC) 10/31/2016  . Chronic low back pain with sciatica 10/31/2016  . Allergic cough 10/31/2016  . Hx of colonic polyps 10/31/2016    Past Surgical History:  Procedure Laterality Date  . ABDOMINAL HYSTERECTOMY  2008   fibroids, ovaries remain  . APPENDECTOMY     1971  . COLONOSCOPY  10/2014 and 06/17/2015   Every 3 yr due to incomplete prep  . HERNIA REPAIR    . 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 TONSILLECTOMY    . WRIST FRACTURE SURGERY     plate put in right wrist     OB History    No data available       Home Medications    Prior to Admission medications   Medication Sig Start Date End Date Taking? Authorizing Provider  amoxicillin (AMOXIL) 875 MG tablet Take 1 tablet (875 mg total) by mouth 2 (two) times daily. 12/21/16  Yes Payton Mccallumonty, [place], MD  ARIPiprazole (ABILIFY) 2 MG tablet Take 2.5 mg by mouth daily.    Yes [provider]  buPROPion (WELLBUTRIN XL) 300 MG 24 hr tablet Take 300 mg by mouth daily.   Yes [provider]  DULoxetine (CYMBALTA) 60 MG capsule Take 60 mg by mouth daily.   Yes [provider]  gabapentin (NEURONTIN) 300 MG capsule Take 1 capsule (300 mg total) by mouth 2 (two) times daily. 10/31/16  Yes Reubin MilanBerglund, Laura H, MD  LamoTRIgine 100 MG TBDP Take 200 mg by mouth daily.   Yes [provider]  chlorpheniramine-HYDROcodone (TUSSIONEX PENNKINETIC ER) 10-8 MG/5ML SUER Take 5 mLs by mouth at bedtime as needed. 12/25/16   Tommie Samsook, Deshay Kirstein G, DO    Family History Family History  Problem Relation Age of Onset  . Hypertension Mother   . Rectal cancer Father   . COPD Father   . Asthma Father   . Breast cancer Neg Hx     Social History Social History   Tobacco Use  . Smoking status: Never Smoker  . Smokeless tobacco: Never Used  Substance Use Topics  . Alcohol use: No  . Drug use: No     Allergies   Patient has no known allergies.   Review of Systems Review of Systems  Constitutional: Negative.   Respiratory: Positive for cough. Negative for shortness of breath.    Physical Exam Triage Vital Signs ED Triage Vitals  Enc Vitals Group     BP 12/25/16 1757 (!) 149/89     Pulse Rate 12/25/16 1757 82  Resp 12/25/16 1757 16     Temp 12/25/16 1757 98 F (36.7 C)     Temp Source 12/25/16 1757 Oral     SpO2 12/25/16 1757 100 %     Weight 12/25/16 1757 160 lb (72.6 kg)     Height 12/25/16 1757 5\' 6"  (1.676 m)     Head Circumference --      Peak Flow --      Pain Score 12/25/16 1758 0     Pain Loc --      Pain Edu? --      Excl. in GC? --    Updated Vital Signs BP (!) 149/89 (BP Location: Left Arm)   Pulse 82   Temp 98 F (36.7 C) (Oral)   Resp 16   Ht 5\' 6"  (1.676 m)   Wt 160 lb (72.6 kg)   SpO2 100%   BMI 25.82 kg/m   Visual Acuity Right Eye Distance:   Left Eye Distance:   Bilateral Distance:    Right Eye Near:   Left Eye Near:    Bilateral Near:     Physical Exam  Constitutional: She is oriented to person, place, and time. She appears well-developed. No distress.  HENT:  Head: Normocephalic and atraumatic.  Mouth/Throat:  Oropharynx is clear and moist.  Cardiovascular: Normal rate and regular rhythm.  No murmur heard. Pulmonary/Chest: Effort normal and breath sounds normal. She has no wheezes. She has no rales.  Neurological: She is alert and oriented to person, place, and time.  Psychiatric: She has a normal mood and affect. Her behavior is normal.  Vitals reviewed.  UC Treatments / Results  Labs (all labs ordered are listed, but only abnormal results are displayed) Labs Reviewed - No data to display  EKG  EKG Interpretation None       Radiology No results found.  Procedures Procedures (including critical care time)  Medications Ordered in UC Medications - No data to display   Initial Impression / Assessment and Plan / UC Course  I have reviewed the triage vital signs and the nursing notes.  Pertinent labs & imaging results that were available during my care of the patient were reviewed by me and considered in my medical decision making (see chart for details).     56 year old female presents with cough. Stopping guaifenesin with codeine.  Starting Tussionex.  Of note, after the visit the [place] database was checked given the fact that she just received a prescription.  He revealed that she has had 4 prescriptions for cough medication in the past 4 months.  This is slightly concerning.  Recommend that if patient returns requesting prescription that this not be given.  Final Clinical Impressions(s) / UC Diagnoses   Final diagnoses:  Cough    ED Discharge Orders        Ordered    chlorpheniramine-HYDROcodone (TUSSIONEX PENNKINETIC ER) 10-8 MG/5ML SUER  At bedtime PRN     12/25/16 1855     Controlled Substance Prescriptions [place] Controlled Substance Registry consulted? Yes. See above.    Tommie SamsCook, Sabatino Williard G, [place]DO 12/25/16 1920

## 2016-12-25 NOTE — Discharge Instructions (Signed)
Take care  Dr. [name]

## 2017-01-14 ENCOUNTER — Encounter: Payer: Self-pay | Admitting: Internal Medicine

## 2017-01-14 ENCOUNTER — Ambulatory Visit (INDEPENDENT_AMBULATORY_CARE_PROVIDER_SITE_OTHER): Payer: 59 | Admitting: Internal Medicine

## 2017-01-14 ENCOUNTER — Other Ambulatory Visit: Payer: Self-pay | Admitting: Internal Medicine

## 2017-01-14 VITALS — BP 132/78 | HR 82 | Temp 98.3°F | Ht 66.0 in | Wt 172.0 lb

## 2017-01-14 DIAGNOSIS — J40 Bronchitis, not specified as acute or chronic: Secondary | ICD-10-CM

## 2017-01-14 MED ORDER — CEFDINIR 300 MG PO CAPS: 300.0000 mg | ORAL_CAPSULE | Freq: Two times a day (BID) | ORAL | 0 refills | Status: AC

## 2017-01-14 MED ORDER — BENZONATATE 100 MG PO CAPS: 100.0000 mg | ORAL_CAPSULE | Freq: Three times a day (TID) | ORAL | 0 refills | Status: DC

## 2017-01-14 MED ORDER — HYDROCOD POLST-CPM POLST ER 10-8 MG/5ML PO SUER: 5.0000 mL | Freq: Two times a day (BID) | ORAL | 0 refills | Status: DC

## 2017-01-14 NOTE — Progress Notes (Signed)
Date:  01/14/2017   Name:  Jenna Estrada   DOB:  12/10/1960   MRN:  [id number]   Chief Complaint: Cough (Started 3 weeks ago- thick green/ yellow mucous. Seen UC on 7th. Was give cough syrup and anitbitoics. Been about a week since off antibiotics and 3-4 days since been off cough meds. Coughing all night long since off meds.   )  Cough  This is a recurrent problem. The current episode started 1 to 4 weeks ago. The problem has been gradually worsening. The problem occurs hourly. The cough is productive of sputum. Associated symptoms include postnasal drip, shortness of breath and wheezing. Pertinent negatives include no chest pain, chills, fever or headaches. The symptoms are aggravated by lying down. She has tried prescription cough suppressant (and amoxicillin) for the symptoms.    Review of Systems  Constitutional: Negative for chills, fatigue and fever.  HENT: Positive for congestion and postnasal drip.   Eyes: Negative for visual disturbance.  Respiratory: Positive for cough, shortness of breath and wheezing. Negative for chest tightness.   Cardiovascular: Negative for chest pain and palpitations.  Gastrointestinal: Negative for abdominal pain, diarrhea and vomiting.  Neurological: Negative for dizziness, numbness and headaches.    Patient Active Problem List   Diagnosis Date Noted  . Bipolar 1 disorder (HCC) 10/31/2016  . Chronic low back pain with sciatica 10/31/2016  . Allergic cough 10/31/2016  . Hx of colonic polyps 10/31/2016    Prior to Admission medications   Medication Sig Start Date End Date Taking? Authorizing Provider  ARIPiprazole (ABILIFY) 2 MG tablet Take 2.5 mg by mouth daily.    Yes [provider]  buPROPion (WELLBUTRIN XL) 300 MG 24 hr tablet Take 300 mg by mouth daily.   Yes [provider]  DULoxetine (CYMBALTA) 60 MG capsule Take 60 mg by mouth daily.   Yes [provider]  gabapentin (NEURONTIN) 300 MG capsule Take 1  capsule (300 mg total) by mouth 2 (two) times daily. 10/31/16  Yes Reubin MilanBerglund, Dava Rensch H, MD  L-methylfolate Calcium 15 MG TABS TK 1 T PO D 12/19/16  Yes [provider]  LamoTRIgine 100 MG TBDP Take 200 mg by mouth daily.   Yes [provider]    No Known Allergies  Past Surgical History:  Procedure Laterality Date  . ABDOMINAL HYSTERECTOMY  2008   fibroids, ovaries remain  . APPENDECTOMY     1971  . COLONOSCOPY  10/2014 and 06/17/2015   Every 3 yr due to incomplete prep  . HERNIA REPAIR    . 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 TONSILLECTOMY    . WRIST FRACTURE SURGERY     plate put in right wrist     Social History   Tobacco Use  . Smoking status: Never Smoker  . Smokeless tobacco: Never Used  Substance Use Topics  . Alcohol use: No  . Drug use: No     Medication list has been reviewed and updated.  PHQ 2/9 Scores 10/31/2016  PHQ - 2 Score 1    Physical Exam  Constitutional: She is oriented to person, place, and time. She appears well-developed. No distress.  HENT:  Head: Normocephalic and atraumatic.  Right Ear: Tympanic membrane and ear canal normal.  Left Ear: Tympanic membrane and ear canal normal.  Nose: Right sinus exhibits no maxillary sinus tenderness. Left sinus exhibits no maxillary sinus tenderness.  Neck: Normal range of motion. Neck supple. No thyromegaly  present.  Cardiovascular: Normal rate, regular rhythm and normal heart sounds.  Pulmonary/Chest: Effort normal and breath sounds normal. No respiratory distress. She has no wheezes.  Musculoskeletal: Normal range of motion.  Neurological: She is alert and oriented to person, place, and time.  Skin: Skin is warm and dry. No rash noted.  Psychiatric: She has a normal mood and affect. Her speech is normal and behavior is normal. Thought content normal.  Nursing note and vitals reviewed.   BP 132/78   Pulse 82   Temp 98.3 F (36.8 C) (Oral)   Ht 5\' 6"  (1.676 m)   Wt 172 lb  (78 kg)   SpO2 98%   BMI 27.76 kg/m   Assessment and Plan: 1. Bronchitis Pt reminded to take tussionex sparingly and only at night Begin Allegra 180 mg daily for PND triggered cough - cefdinir (OMNICEF) 300 MG capsule; Take 1 capsule (300 mg total) by mouth 2 (two) times daily for 10 days.  Dispense: 20 capsule; Refill: 0 - benzonatate (TESSALON) 100 MG capsule; Take 1 capsule (100 mg total) by mouth 3 (three) times daily.  Dispense: 30 capsule; Refill: 0 - chlorpheniramine-HYDROcodone (TUSSIONEX PENNKINETIC ER) 10-8 MG/5ML SUER; Take 5 mLs by mouth 2 (two) times daily.  Dispense: 115 mL; Refill: 0   Meds ordered this encounter  Medications  . cefdinir (OMNICEF) 300 MG capsule    Sig: Take 1 capsule (300 mg total) by mouth 2 (two) times daily for 10 days.    Dispense:  20 capsule    Refill:  0  . benzonatate (TESSALON) 100 MG capsule    Sig: Take 1 capsule (100 mg total) by mouth 3 (three) times daily.    Dispense:  30 capsule    Refill:  0  . chlorpheniramine-HYDROcodone (TUSSIONEX PENNKINETIC ER) 10-8 MG/5ML SUER    Sig: Take 5 mLs by mouth 2 (two) times daily.    Dispense:  115 mL    Refill:  0    Partially dictated using Animal nutritionistDragon software. Any errors are unintentional.  Bari EdwardLaura Maylen Waltermire, MD [facility]  01/14/2017

## 2017-01-14 NOTE — Patient Instructions (Signed)
Allegra 180mg once a day

## 2017-01-31 ENCOUNTER — Encounter: Payer: Self-pay | Admitting: Internal Medicine

## 2017-01-31 ENCOUNTER — Ambulatory Visit (INDEPENDENT_AMBULATORY_CARE_PROVIDER_SITE_OTHER): Payer: 59 | Admitting: Internal Medicine

## 2017-01-31 VITALS — BP 122/82 | HR 80 | Temp 98.3°F | Ht 66.0 in | Wt 171.0 lb

## 2017-01-31 DIAGNOSIS — J0191 Acute recurrent sinusitis, unspecified: Secondary | ICD-10-CM

## 2017-01-31 MED ORDER — LEVOFLOXACIN 500 MG PO TABS: 500.0000 mg | ORAL_TABLET | Freq: Every day | ORAL | 0 refills | Status: AC

## 2017-01-31 MED ORDER — FLUTICASONE PROPIONATE 50 MCG/ACT NA SUSP: 2.0000 | Freq: Every day | NASAL | 6 refills | Status: DC

## 2017-01-31 MED ORDER — PREDNISONE 10 MG PO TABS: ORAL_TABLET | ORAL | 0 refills | Status: DC

## 2017-01-31 MED ORDER — HYDROCOD POLST-CPM POLST ER 10-8 MG/5ML PO SUER
5.0000 mL | Freq: Two times a day (BID) | ORAL | 0 refills | Status: DC
Start: 2017-01-31 — End: 2017-05-22

## 2017-01-31 NOTE — Progress Notes (Signed)
Date:  01/31/2017   Name:  Jenna Estrada   DOB:  28-Oct-1960   MRN:  [id number]   Chief Complaint: Follow-up (been on 2 rounds of antibiotic- 2 rounds of cough syrup/ pills- some better)  Sinusitis  This is a recurrent problem. The problem has been gradually worsening since onset. There has been no fever. The pain is mild. Associated symptoms include congestion, coughing, headaches, sinus pressure and a sore throat. The treatment provided mild relief.  Since July she has had 4 courses of antibiotics - three courses of Amox and last visit Cefdinir.  Each times she feels improved for several days then sx resume.   Review of Systems  HENT: Positive for congestion, sinus pressure and sore throat.   Eyes: Negative for visual disturbance.  Respiratory: Positive for cough. Negative for chest tightness, wheezing and stridor.   Cardiovascular: Negative for chest pain and palpitations.  Gastrointestinal: Negative for diarrhea, nausea and vomiting.  Neurological: Positive for headaches. Negative for dizziness, tremors, weakness and light-headedness.  Hematological: Negative for adenopathy.  Psychiatric/Behavioral: Positive for sleep disturbance (from cough).    Patient Active Problem List   Diagnosis Date Noted  . Bipolar 1 disorder (HCC) 10/31/2016  . Chronic low back pain with sciatica 10/31/2016  . Allergic cough 10/31/2016  . Hx of colonic polyps 10/31/2016    Prior to Admission medications   Medication Sig Start Date End Date Taking? Authorizing Provider  ARIPiprazole (ABILIFY) 2 MG tablet Take 2.5 mg by mouth daily.    Yes [provider]  benzonatate (TESSALON) 100 MG capsule Take 1 capsule (100 mg total) by mouth 3 (three) times daily. 01/14/17  Yes Reubin Milan, MD  buPROPion (WELLBUTRIN XL) 300 MG 24 hr tablet Take 300 mg by mouth daily.   Yes [provider]  chlorpheniramine-HYDROcodone (TUSSIONEX PENNKINETIC ER) 10-8 MG/5ML SUER Take 5 mLs by mouth  2 (two) times daily. 01/14/17  Yes Reubin Milan, MD  DULoxetine (CYMBALTA) 60 MG capsule Take 60 mg by mouth daily.   Yes [provider]  gabapentin (NEURONTIN) 300 MG capsule Take 1 capsule (300 mg total) by mouth 2 (two) times daily. 10/31/16  Yes Reubin Milan, MD  L-methylfolate Calcium 15 MG TABS TK 1 T PO D 12/19/16  Yes [provider]  LamoTRIgine 100 MG TBDP Take 200 mg by mouth daily.   Yes [provider]  loratadine (CLARITIN) 10 MG tablet Take 10 mg by mouth daily.   Yes [provider]    No Known Allergies  Past Surgical History:  Procedure Laterality Date  . ABDOMINAL HYSTERECTOMY  2008   fibroids, ovaries remain  . APPENDECTOMY     1971  . COLONOSCOPY  10/2014 and 06/17/2015   Every 3 yr due to incomplete prep  . HERNIA REPAIR    . 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 TONSILLECTOMY    . WRIST FRACTURE SURGERY     plate put in right wrist     Social History   Tobacco Use  . Smoking status: Never Smoker  . Smokeless tobacco: Never Used  Substance Use Topics  . Alcohol use: No  . Drug use: No     Medication list has been reviewed and updated.  PHQ 2/9 Scores 10/31/2016  PHQ - 2 Score 1    Physical Exam  Constitutional: She is oriented to person, place, and time. She appears well-developed and well-nourished.  HENT:  Right  Ear: External ear and ear canal normal. Tympanic membrane is not erythematous and not retracted.  Left Ear: External ear and ear canal normal. Tympanic membrane is not erythematous and not retracted.  Nose: Right sinus exhibits maxillary sinus tenderness and frontal sinus tenderness. Left sinus exhibits maxillary sinus tenderness and frontal sinus tenderness.  Mouth/Throat: Uvula is midline and mucous membranes are normal.  Neck: Normal range of motion. Neck supple.  Cardiovascular: Normal rate, regular rhythm and normal heart sounds.  Pulmonary/Chest: Breath sounds normal. She has no  wheezes. She has no rales.  Lymphadenopathy:    She has no cervical adenopathy.  Neurological: She is alert and oriented to person, place, and time.    BP 122/82   Pulse 80   Temp 98.3 F (36.8 C) (Oral)   Ht 5\' 6"  (1.676 m)   Wt 171 lb (77.6 kg)   SpO2 97%   BMI 27.60 kg/m   Assessment and Plan: 1. Acute recurrent sinusitis, unspecified location May need ENT evaluation if persistent/recurrent - fluticasone (FLONASE) 50 MCG/ACT nasal spray; Place 2 sprays into both nostrils daily.  Dispense: 16 g; Refill: 6 - predniSONE (DELTASONE) 10 MG tablet; Take 6 on day 1, 5 on day 2, 4 on day 3, 3 on day 4, 2 on day 5 and 1 on day 1 then stop.  Dispense: 21 tablet; Refill: 0 - levofloxacin (LEVAQUIN) 500 MG tablet; Take 1 tablet (500 mg total) by mouth daily for 7 days.  Dispense: 7 tablet; Refill: 0 - chlorpheniramine-HYDROcodone (TUSSIONEX PENNKINETIC ER) 10-8 MG/5ML SUER; Take 5 mLs by mouth 2 (two) times daily.  Dispense: 115 mL; Refill: 0   Meds ordered this encounter  Medications  . fluticasone (FLONASE) 50 MCG/ACT nasal spray    Sig: Place 2 sprays into both nostrils daily.    Dispense:  16 g    Refill:  6  . predniSONE (DELTASONE) 10 MG tablet    Sig: Take 6 on day 1, 5 on day 2, 4 on day 3, 3 on day 4, 2 on day 5 and 1 on day 1 then stop.    Dispense:  21 tablet    Refill:  0  . levofloxacin (LEVAQUIN) 500 MG tablet    Sig: Take 1 tablet (500 mg total) by mouth daily for 7 days.    Dispense:  7 tablet    Refill:  0  . chlorpheniramine-HYDROcodone (TUSSIONEX PENNKINETIC ER) 10-8 MG/5ML SUER    Sig: Take 5 mLs by mouth 2 (two) times daily.    Dispense:  115 mL    Refill:  0    Partially dictated using Animal nutritionistDragon software. Any errors are unintentional.  Bari EdwardLaura Tyrique Sporn, MD [facility]  01/31/2017

## 2017-02-01 ENCOUNTER — Encounter: Payer: Self-pay | Admitting: Internal Medicine

## 2017-04-03 ENCOUNTER — Other Ambulatory Visit: Payer: Self-pay

## 2017-04-03 DIAGNOSIS — M5440 Lumbago with sciatica, unspecified side: Principal | ICD-10-CM

## 2017-04-03 DIAGNOSIS — G8929 Other chronic pain: Secondary | ICD-10-CM

## 2017-04-03 MED ORDER — GABAPENTIN 300 MG PO CAPS: 300.0000 mg | ORAL_CAPSULE | Freq: Two times a day (BID) | ORAL | 1 refills | Status: DC

## 2017-04-09 ENCOUNTER — Other Ambulatory Visit: Payer: Self-pay

## 2017-04-09 DIAGNOSIS — G8929 Other chronic pain: Secondary | ICD-10-CM

## 2017-04-09 DIAGNOSIS — M5440 Lumbago with sciatica, unspecified side: Principal | ICD-10-CM

## 2017-04-09 MED ORDER — GABAPENTIN 300 MG PO CAPS: 300.0000 mg | ORAL_CAPSULE | Freq: Two times a day (BID) | ORAL | 1 refills | Status: DC

## 2017-05-22 ENCOUNTER — Encounter: Payer: Self-pay | Admitting: Internal Medicine

## 2017-05-22 ENCOUNTER — Ambulatory Visit (INDEPENDENT_AMBULATORY_CARE_PROVIDER_SITE_OTHER): Payer: 59 | Admitting: Internal Medicine

## 2017-05-22 VITALS — BP 110/78 | HR 84 | Resp 16 | Ht 66.0 in | Wt 145.0 lb

## 2017-05-22 DIAGNOSIS — Z0001 Encounter for general adult medical examination with abnormal findings: Secondary | ICD-10-CM | POA: Diagnosis not present

## 2017-05-22 DIAGNOSIS — F319 Bipolar disorder, unspecified: Secondary | ICD-10-CM

## 2017-05-22 DIAGNOSIS — Z86010 Personal history of colonic polyps: Secondary | ICD-10-CM | POA: Diagnosis not present

## 2017-05-22 DIAGNOSIS — Z1239 Encounter for other screening for malignant neoplasm of breast: Secondary | ICD-10-CM

## 2017-05-22 DIAGNOSIS — R058 Other specified cough: Secondary | ICD-10-CM

## 2017-05-22 DIAGNOSIS — Z1159 Encounter for screening for other viral diseases: Secondary | ICD-10-CM

## 2017-05-22 DIAGNOSIS — R05 Cough: Secondary | ICD-10-CM

## 2017-05-22 DIAGNOSIS — Z0000 Encounter for general adult medical examination without abnormal findings: Secondary | ICD-10-CM

## 2017-05-22 LAB — POCT URINALYSIS DIPSTICK
BILIRUBIN UA: NEGATIVE
Glucose, UA: NEGATIVE
Ketones, UA: NEGATIVE
Leukocytes, UA: NEGATIVE
Nitrite, UA: NEGATIVE
PH UA: 7.5 (ref 5.0–8.0)
Protein, UA: NEGATIVE
RBC UA: NEGATIVE
Spec Grav, UA: 1.025 (ref 1.010–1.025)
UROBILINOGEN UA: 0.2 U/dL

## 2017-05-22 NOTE — Patient Instructions (Signed)

## 2017-05-22 NOTE — Progress Notes (Signed)
Date:  05/22/2017   Name:  Jenna Estrada   DOB:  [birth year]   MRN:  [id number]   Chief Complaint: Annual Exam and Cough (HA.ST. and after that got better has cough since Sat only at night. ) Jenna Estrada is a 57 y.o. female who presents today for her Complete Annual Exam. She feels well. She reports exercising walking. She reports she is sleeping fairly well until lately with cough.  Colonoscopy was done in 2017 - due for repeat next year. Mammogram done in December. She denies breast issues. S/P partial hysterectomy - no longer needs Pap smears. Had Dermatology appt recently and does annual skin exams.  Cough  The current episode started in the past 7 days. The problem has been unchanged. The problem occurs every few hours. Pertinent negatives include no chest pain, chills, fever, headaches, rash, shortness of breath or wheezing. The symptoms are aggravated by lying down.   Bipolar disorder- followed by Psych and therapist.  Recently a bit stressed but feeling better.  Abilify was stopped and she has been able to lose about 20 lbs along with diet change.  Review of Systems  Constitutional: Positive for unexpected weight change (20 lbs since stopping Abilify). Negative for chills, fatigue and fever.  HENT: Negative for congestion, hearing loss, tinnitus, trouble swallowing and voice change.   Eyes: Negative for visual disturbance.  Respiratory: Positive for cough. Negative for chest tightness, shortness of breath and wheezing.   Cardiovascular: Negative for chest pain, palpitations and leg swelling.  Gastrointestinal: Negative for abdominal pain, constipation, diarrhea and vomiting.  Endocrine: Negative for polydipsia and polyuria.  Genitourinary: Negative for dysuria, frequency, genital sores, vaginal bleeding and vaginal discharge.  Musculoskeletal: Negative for arthralgias, gait problem and joint swelling.  Skin: Negative for color change and rash.  Neurological:  Negative for dizziness, tremors, light-headedness and headaches.  Hematological: Negative for adenopathy. Does not bruise/bleed easily.  Psychiatric/Behavioral: Negative for dysphoric mood and sleep disturbance. The patient is not nervous/anxious.     Patient Active Problem List   Diagnosis Date Noted  . Bipolar 1 disorder (HCC) 10/31/2016  . Chronic low back pain with sciatica 10/31/2016  . Allergic cough 10/31/2016  . Hx of colonic polyps 10/31/2016    Prior to Admission medications   Medication Sig Start Date End Date Taking? Authorizing Provider       [provider]    01/14/17   Reubin Milan, MD  buPROPion (WELLBUTRIN XL) 300 MG 24 hr tablet Take 300 mg by mouth daily.    [provider]         DULoxetine (CYMBALTA) 60 MG capsule Take 60 mg by mouth daily.    [provider]  fluticasone (FLONASE) 50 MCG/ACT nasal spray Place 2 sprays into both nostrils daily. 01/31/17   Reubin Milan, MD  gabapentin (NEURONTIN) 300 MG capsule Take 1 capsule (300 mg total) by mouth 2 (two) times daily. 04/09/17   Reubin Milan, MD  L-methylfolate Calcium 15 MG TABS TK 1 T PO D 12/19/16   [provider]  LamoTRIgine 100 MG TBDP Take 200 mg by mouth daily.    [provider]  loratadine (CLARITIN) 10 MG tablet Take 10 mg by mouth daily.    [provider]           No Known Allergies  Past Surgical History:  Procedure Laterality Date  . ABDOMINAL HYSTERECTOMY  2008   fibroids, ovaries remain  .  APPENDECTOMY     1971  . COLONOSCOPY  10/2014 and 06/17/2015   Every 3 yr due to incomplete prep  . HERNIA REPAIR    . 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 TONSILLECTOMY    . WRIST FRACTURE SURGERY     plate put in right wrist     Social History   Tobacco Use  . Smoking status: Never Smoker  . Smokeless tobacco: Never Used  Substance Use Topics  . Alcohol use: No  . Drug use: No     Medication list has been  reviewed and updated.  PHQ 2/9 Scores 05/22/2017 10/31/2016  PHQ - 2 Score 2 1  PHQ- 9 Score 4 -    Physical Exam  Constitutional: She is oriented to person, place, and time. She appears well-developed and well-nourished. No distress.  HENT:  Head: Normocephalic and atraumatic.  Right Ear: Tympanic membrane and ear canal normal.  Left Ear: Tympanic membrane and ear canal normal.  Nose: Right sinus exhibits no maxillary sinus tenderness. Left sinus exhibits no maxillary sinus tenderness.  Mouth/Throat: Uvula is midline and oropharynx is clear and moist.  Eyes: Conjunctivae and EOM are normal. Right eye exhibits no discharge. Left eye exhibits no discharge. No scleral icterus.  Neck: Normal range of motion. Carotid bruit is not present. No erythema present. No thyromegaly present.  Cardiovascular: Normal rate, regular rhythm, normal heart sounds and normal pulses.  Pulmonary/Chest: Effort normal. No respiratory distress. She has no wheezes. Right breast exhibits no mass, no nipple discharge, no skin change and no tenderness. Left breast exhibits no mass, no nipple discharge, no skin change and no tenderness.  Abdominal: Soft. Bowel sounds are normal. There is no hepatosplenomegaly. There is no tenderness. There is no CVA tenderness.  Musculoskeletal: Normal range of motion.  Lymphadenopathy:    She has no cervical adenopathy.    She has no axillary adenopathy.  Neurological: She is alert and oriented to person, place, and time. She has normal reflexes. No cranial nerve deficit or sensory deficit.  Skin: Skin is warm, dry and intact. No rash noted.  Psychiatric: She has a normal mood and affect. Her speech is normal and behavior is normal. Thought content normal.  Nursing note and vitals reviewed.   BP 110/78   Pulse 84   Resp 16   Ht  (1.676 m)   Wt 145 lb (65.8 kg)   SpO2 98%   BMI 23.40 kg/m   Assessment and Plan: 1. Annual physical exam Continue healthy diet  - Lipid  panel - POCT urinalysis dipstick  2. Breast cancer screening Schedule in December - MM DIGITAL SCREENING BILATERAL; Future  3. Allergic cough Resume Claritin  4. Hx of colonic polyps Due next year for colonoscopy  5. Bipolar 1 disorder (HCC) Followed by Psychiatry - CBC with Differential/Platelet - Comprehensive metabolic panel - TSH  6. Need for hepatitis C screening test - Hepatitis C antibody   No orders of the defined types were placed in this encounter.   Partially dictated using Animal nutritionist. Any errors are unintentional.  Bari Edward, MD [facility]  05/22/2017

## 2017-05-23 ENCOUNTER — Other Ambulatory Visit: Payer: Self-pay | Admitting: Internal Medicine

## 2017-05-23 DIAGNOSIS — R05 Cough: Secondary | ICD-10-CM

## 2017-05-23 DIAGNOSIS — R059 Cough, unspecified: Secondary | ICD-10-CM

## 2017-05-23 LAB — COMPREHENSIVE METABOLIC PANEL
ALBUMIN: 4.7 g/dL (ref 3.5–5.5)
ALK PHOS: 101 IU/L (ref 39–117)
ALT: 19 IU/L (ref 0–32)
AST: 18 IU/L (ref 0–40)
Albumin/Globulin Ratio: 2 (ref 1.2–2.2)
BILIRUBIN TOTAL: 0.2 mg/dL (ref 0.0–1.2)
BUN/Creatinine Ratio: 16 (ref 9–23)
BUN: 14 mg/dL (ref 6–24)
CHLORIDE: 98 mmol/L (ref 96–106)
CO2: 27 mmol/L (ref 20–29)
CREATININE: 0.88 mg/dL (ref 0.57–1.00)
Calcium: 9.8 mg/dL (ref 8.7–10.2)
GFR calc non Af Amer: 73 mL/min/{1.73_m2} (ref >59)
GFR, EST AFRICAN AMERICAN: 84 mL/min/{1.73_m2} (ref >59)
GLOBULIN, TOTAL: 2.4 g/dL (ref 1.5–4.5)
GLUCOSE: 78 mg/dL (ref 65–99)
POTASSIUM: 5.2 mmol/L (ref 3.5–5.2)
Sodium: 140 mmol/L (ref 134–144)
TOTAL PROTEIN: 7.1 g/dL (ref 6.0–8.5)

## 2017-05-23 LAB — CBC WITH DIFFERENTIAL/PLATELET
Basophils Absolute: 0 10*3/uL (ref 0.0–0.2)
Basos: 0 %
EOS (ABSOLUTE): 0.2 10*3/uL (ref 0.0–0.4)
Eos: 2 %
HEMOGLOBIN: 13.1 g/dL (ref 11.1–15.9)
Hematocrit: 41.4 % (ref 34.0–46.6)
IMMATURE GRANS (ABS): 0 10*3/uL (ref 0.0–0.1)
Immature Granulocytes: 0 %
LYMPHS ABS: 2.1 10*3/uL (ref 0.7–3.1)
LYMPHS: 22 %
MCH: 27 pg (ref 26.6–33.0)
MCHC: 31.6 g/dL (ref 31.5–35.7)
MCV: 85 fL (ref 79–97)
MONOCYTES: 5 %
Monocytes Absolute: 0.5 10*3/uL (ref 0.1–0.9)
NEUTROS ABS: 6.8 10*3/uL (ref 1.4–7.0)
Neutrophils: 71 %
Platelets: 303 10*3/uL (ref 150–379)
RBC: 4.86 x10E6/uL (ref 3.77–5.28)
RDW: 14.2 % (ref 12.3–15.4)
WBC: 9.7 10*3/uL (ref 3.4–10.8)

## 2017-05-23 LAB — LIPID PANEL
CHOLESTEROL TOTAL: 206 mg/dL — AB (ref 100–199)
Chol/HDL Ratio: 3 ratio (ref 0.0–4.4)
HDL: 68 mg/dL (ref >39)
LDL CALC: 106 mg/dL — AB (ref 0–99)
TRIGLYCERIDES: 158 mg/dL — AB (ref 0–149)
VLDL Cholesterol Cal: 32 mg/dL (ref 5–40)

## 2017-05-23 LAB — HEPATITIS C ANTIBODY: Hep C Virus Ab: 0.3 s/co ratio (ref 0.0–0.9)

## 2017-05-23 LAB — TSH: TSH: 3.42 u[IU]/mL (ref 0.450–4.500)

## 2017-05-23 MED ORDER — HYDROCOD POLST-CPM POLST ER 10-8 MG/5ML PO SUER: 5.0000 mL | Freq: Every evening | ORAL | 0 refills | Status: DC | PRN

## 2017-05-31 ENCOUNTER — Ambulatory Visit (INDEPENDENT_AMBULATORY_CARE_PROVIDER_SITE_OTHER): Payer: 59 | Admitting: Internal Medicine

## 2017-05-31 ENCOUNTER — Encounter: Payer: Self-pay | Admitting: Internal Medicine

## 2017-05-31 VITALS — BP 130/76 | HR 84 | Temp 98.7°F | Resp 16 | Ht 66.0 in | Wt 146.3 lb

## 2017-05-31 DIAGNOSIS — R05 Cough: Secondary | ICD-10-CM

## 2017-05-31 DIAGNOSIS — J0110 Acute frontal sinusitis, unspecified: Secondary | ICD-10-CM | POA: Diagnosis not present

## 2017-05-31 DIAGNOSIS — R059 Cough, unspecified: Secondary | ICD-10-CM

## 2017-05-31 MED ORDER — PREDNISONE 10 MG PO TABS: ORAL_TABLET | ORAL | 0 refills | Status: DC

## 2017-05-31 MED ORDER — AMOXICILLIN-POT CLAVULANATE 875-125 MG PO TABS: 1.0000 | ORAL_TABLET | Freq: Two times a day (BID) | ORAL | 0 refills | Status: AC

## 2017-05-31 MED ORDER — HYDROCOD POLST-CPM POLST ER 10-8 MG/5ML PO SUER: 5.0000 mL | Freq: Every evening | ORAL | 0 refills | Status: DC | PRN

## 2017-05-31 NOTE — Patient Instructions (Signed)
Continue Claritin

## 2017-05-31 NOTE — Progress Notes (Signed)
Date:  05/31/2017   Name:  Jenna Estrada   DOB:  Jun 25, 1960   MRN:  [id number]   Chief Complaint: Sinus Problem (Having worse cough than when CPE. Feels it) Sinusitis  The problem has been gradually worsening since onset. There has been no fever. Associated symptoms include congestion, coughing, headaches and sinus pressure. Pertinent negatives include no shortness of breath.     Review of Systems  HENT: Positive for congestion and sinus pressure.   Respiratory: Positive for cough. Negative for shortness of breath and wheezing.   Cardiovascular: Negative for chest pain and palpitations.  Neurological: Positive for headaches.    Patient Active Problem List   Diagnosis Date Noted  . Bipolar 1 disorder (HCC) 10/31/2016  . Chronic low back pain with sciatica 10/31/2016  . Allergic cough 10/31/2016  . Hx of colonic polyps 10/31/2016    Prior to Admission medications   Medication Sig Start Date End Date Taking? Authorizing Provider  buPROPion (WELLBUTRIN XL) 300 MG 24 hr tablet Take 300 mg by mouth daily.   Yes [provider]  chlorpheniramine-HYDROcodone (TUSSIONEX PENNKINETIC ER) 10-8 MG/5ML SUER Take 5 mLs by mouth at bedtime as needed. 05/23/17  Yes Reubin Milan, MD  DULoxetine (CYMBALTA) 60 MG capsule Take 60 mg by mouth daily.   Yes [provider]  Fexofenadine HCl (ALLERGY 24-HR PO) Take by mouth.   Yes [provider]  gabapentin (NEURONTIN) 300 MG capsule Take 1 capsule (300 mg total) by mouth 2 (two) times daily. 04/09/17  Yes Reubin Milan, MD  L-methylfolate Calcium 15 MG TABS TK 1 T PO D 12/19/16  Yes [provider]  LamoTRIgine 100 MG TBDP Take 200 mg by mouth daily.   Yes [provider]  Pseudoeph-Doxylamine-DM-APAP (NYQUIL PO) Take by mouth.   Yes [provider]    No Known Allergies  Past Surgical History:  Procedure Laterality Date  . ABDOMINAL HYSTERECTOMY  2008   fibroids, ovaries remain    . APPENDECTOMY     1971  . COLONOSCOPY  10/2014 and 06/17/2015   Every 3 yr due to incomplete prep  . HERNIA REPAIR    . 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 TONSILLECTOMY    . WRIST FRACTURE SURGERY     plate put in right wrist     Social History   Tobacco Use  . Smoking status: Never Smoker  . Smokeless tobacco: Never Used  Substance Use Topics  . Alcohol use: No  . Drug use: No     Medication list has been reviewed and updated.  PHQ 2/9 Scores 05/22/2017 10/31/2016  PHQ - 2 Score 2 1  PHQ- 9 Score 4 -    Physical Exam  Constitutional: She is oriented to person, place, and time. She appears well-developed and well-nourished.  HENT:  Right Ear: External ear and ear canal normal. Tympanic membrane is not erythematous and not retracted.  Left Ear: External ear and ear canal normal. Tympanic membrane is not erythematous and not retracted.  Nose: Right sinus exhibits frontal sinus tenderness. Right sinus exhibits no maxillary sinus tenderness. Left sinus exhibits frontal sinus tenderness. Left sinus exhibits no maxillary sinus tenderness.  Mouth/Throat: Uvula is midline and mucous membranes are normal. No oral lesions. No oropharyngeal exudate or posterior oropharyngeal erythema.  Cardiovascular: Normal rate, regular rhythm and normal heart sounds.  Pulmonary/Chest: Breath sounds normal. She has no wheezes. She has no rales.  Lymphadenopathy:  She has no cervical adenopathy.  Neurological: She is alert and oriented to person, place, and time.    BP 130/76   Pulse 84   Temp 98.7 F (37.1 C)   Resp 16   Ht  (1.676 m)   Wt 146 lb 4.8 oz (66.4 kg)   SpO2 97%   BMI 23.61 kg/m   Assessment and Plan: 1. Acute non-recurrent frontal sinusitis Take claritin daily Augmentin and prednisone taper  2. Cough - chlorpheniramine-HYDROcodone (TUSSIONEX PENNKINETIC ER) 10-8 MG/5ML SUER; Take 5 mLs by mouth at bedtime as needed.  Dispense: 115 mL; Refill: 0   Meds  ordered this encounter  Medications  . amoxicillin-clavulanate (AUGMENTIN) 875-125 MG tablet    Sig: Take 1 tablet by mouth 2 (two) times daily for 10 days.    Dispense:  20 tablet    Refill:  0  . predniSONE (DELTASONE) 10 MG tablet    Sig: Take 6 on day 1, 5 on day 2, 4 on day 3, 3 on day 4, 2 on day 5 and 1 on day 1 then stop.    Dispense:  21 tablet    Refill:  0  . chlorpheniramine-HYDROcodone (TUSSIONEX PENNKINETIC ER) 10-8 MG/5ML SUER    Sig: Take 5 mLs by mouth at bedtime as needed.    Dispense:  115 mL    Refill:  0    Partially dictated using Animal nutritionist. Any errors are unintentional.  Bari Edward, MD [facility]  05/31/2017

## 2017-06-13 ENCOUNTER — Ambulatory Visit
Admission: EM | Admit: 2017-06-13 | Discharge: 2017-06-13 | Disposition: A | Discharge status: Home / Self Care | Payer: 59 | Attending: Nurse Practitioner | Admitting: Nurse Practitioner

## 2017-06-13 ENCOUNTER — Encounter: Payer: Self-pay | Admitting: Emergency Medicine

## 2017-06-13 ENCOUNTER — Other Ambulatory Visit: Payer: Self-pay

## 2017-06-13 DIAGNOSIS — R110 Nausea: Secondary | ICD-10-CM | POA: Diagnosis not present

## 2017-06-13 DIAGNOSIS — R197 Diarrhea, unspecified: Secondary | ICD-10-CM | POA: Diagnosis not present

## 2017-06-13 DIAGNOSIS — R109 Unspecified abdominal pain: Secondary | ICD-10-CM

## 2017-06-13 LAB — COMPREHENSIVE METABOLIC PANEL
ALBUMIN: 3.4 g/dL — AB (ref 3.5–5.0)
ALT: 16 U/L (ref 14–54)
AST: 16 U/L (ref 15–41)
Alkaline Phosphatase: 86 U/L (ref 38–126)
Anion gap: 9 (ref 5–15)
BILIRUBIN TOTAL: 0.4 mg/dL (ref 0.3–1.2)
BUN: 13 mg/dL (ref 6–20)
CO2: 25 mmol/L (ref 22–32)
CREATININE: 0.73 mg/dL (ref 0.44–1.00)
Calcium: 8.6 mg/dL — ABNORMAL LOW (ref 8.9–10.3)
Chloride: 101 mmol/L (ref 101–111)
GFR calc Af Amer: >60 mL/min (ref >60)
GLUCOSE: 118 mg/dL — AB (ref 65–99)
POTASSIUM: 3.9 mmol/L (ref 3.5–5.1)
Sodium: 135 mmol/L (ref 135–145)
TOTAL PROTEIN: 6.5 g/dL (ref 6.5–8.1)

## 2017-06-13 LAB — C DIFFICILE QUICK SCREEN W PCR REFLEX
C Diff antigen: NEGATIVE
C Diff interpretation: NOT DETECTED
C Diff toxin: NEGATIVE

## 2017-06-13 MED ORDER — DIPHENOXYLATE-ATROPINE 2.5-0.025 MG PO TABS: 2.0000 | ORAL_TABLET | Freq: Four times a day (QID) | ORAL | 0 refills | Status: DC | PRN

## 2017-06-13 MED ORDER — ONDANSETRON 8 MG PO TBDP: 8.0000 mg | ORAL_TABLET | Freq: Three times a day (TID) | ORAL | 0 refills | Status: DC | PRN

## 2017-06-13 MED ORDER — DICYCLOMINE HCL 20 MG PO TABS: 20.0000 mg | ORAL_TABLET | Freq: Two times a day (BID) | ORAL | 0 refills | Status: DC

## 2017-06-13 NOTE — ED Triage Notes (Signed)
Patient c/o diarrhea x 1 week. 8-10 stools per day. Denies blood in her stool. Denies fever.

## 2017-06-13 NOTE — Discharge Instructions (Signed)
Your stool has been tested for an infection called C. Difficile. Results should be available in the next 24-48 hours. Antibiotic treatment for this will depend on the results. Supportive care advised for now. Take medications as prescribed. Drink plenty of fluids to stay hydrated. Continue BRAT/bland diet. Follow-up with your primary care provider on Monday. Go to the ED immediately if you have severe abdominal pain, persistent vomiting, fevers, unable to keep anything down or any other concerns.

## 2017-06-13 NOTE — ED Provider Notes (Signed)
[facility]    CSN: [id number] Arrival date & time: 06/13/17  0801     History   Chief Complaint Chief Complaint  Patient presents with  . Diarrhea    HPI Jenna Estrada is a 57 y.o. female.   Subjective:   Jenna Estrada is a 57 y.o. female who presents for evaluation of diarrhea. Onset of diarrhea was a week ago. Diarrhea is occurring approximately several times per day. Patient describes diarrhea as watery. Diarrhea has been associated with abdominal pain described as aching and cramping, recent antibiotic therapy (Augmentin) for URI, suspicious food/drink as she ate a hot dog about 1 hour prior to symptom onset and weight loss of 5 lbs since onset.  Patient denies blood in stool, fever, illness in household contacts, recent travel or significant abdominal pain. Evaluation to date: none. Treatment to date: imodium with minimal relief.  Patient has a decreased appetite but is able to tolerate PO without difficulty.  The following portions of the patient's history were reviewed and updated as appropriate: allergies, current medications, past family history, past medical history, past social history, past surgical history and problem list.        Past Medical History:  Diagnosis Date  . Anxiety   . Bipolar 1 disorder (HCC)   . Sciatic nerve pain, left    taking gabapentin.    Patient Active Problem List   Diagnosis Date Noted  . Bipolar 1 disorder (HCC) 10/31/2016  . Chronic low back pain with sciatica 10/31/2016  . Allergic cough 10/31/2016  . Hx of colonic polyps 10/31/2016    Past Surgical History:  Procedure Laterality Date  . ABDOMINAL HYSTERECTOMY  2008   fibroids, ovaries remain  . APPENDECTOMY     1971  . COLONOSCOPY  10/2014 and 06/17/2015   Every 3 yr due to incomplete prep  . HERNIA REPAIR    . 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 TONSILLECTOMY    . WRIST FRACTURE SURGERY     plate put in right wrist     OB History    None      Home Medications    Prior to Admission medications   Medication Sig Start Date End Date Taking? Authorizing Provider  buPROPion (WELLBUTRIN XL) 300 MG 24 hr tablet Take 300 mg by mouth daily.   Yes [provider]  chlorpheniramine-HYDROcodone (TUSSIONEX PENNKINETIC ER) 10-8 MG/5ML SUER Take 5 mLs by mouth at bedtime as needed. 05/31/17  Yes Reubin Milan, MD  DULoxetine (CYMBALTA) 60 MG capsule Take 60 mg by mouth daily.   Yes [provider]  gabapentin (NEURONTIN) 300 MG capsule Take 1 capsule (300 mg total) by mouth 2 (two) times daily. 04/09/17  Yes Reubin Milan, MD  L-methylfolate Calcium 15 MG TABS TK 1 T PO D 12/19/16  Yes [provider]  LamoTRIgine 100 MG TBDP Take 200 mg by mouth daily.   Yes [provider]  dicyclomine (BENTYL) 20 MG tablet Take 1 tablet (20 mg total) by mouth 2 (two) times daily. 06/13/17   Lurline Idol, FNP  diphenoxylate-atropine (LOMOTIL) 2.5-0.025 MG tablet Take 2 tablets by mouth 4 (four) times daily as needed for diarrhea or loose stools. 06/13/17   Lurline Idol, FNP  ondansetron (ZOFRAN ODT) 8 MG disintegrating tablet Take 1 tablet (8 mg total) by mouth every 8 (eight) hours as needed for nausea or vomiting. 06/13/17   Lurline Idol, FNP    Family  History Family History  Problem Relation Age of Onset  . Hypertension Mother   . Rectal cancer Father   . COPD Father   . Asthma Father   . Breast cancer Neg Hx     Social History Social History   Tobacco Use  . Smoking status: Never Smoker  . Smokeless tobacco: Never Used  Substance Use Topics  . Alcohol use: No  . Drug use: No     Allergies   Patient has no known allergies.   Review of Systems Review of Systems  Constitutional: Positive for unexpected weight change. Negative for fever.  Gastrointestinal: Positive for abdominal pain, diarrhea and nausea. Negative for abdominal distention, anal bleeding, blood in stool,  constipation, rectal pain and vomiting.  Genitourinary: Negative for dysuria.  All other systems reviewed and are negative.    Physical Exam Triage Vital Signs ED Triage Vitals  Enc Vitals Group     BP 06/13/17 0813 (!) 124/109     Pulse Rate 06/13/17 0813 86     Resp 06/13/17 0813 18     Temp 06/13/17 0813 98.5 F (36.9 C)     Temp Source 06/13/17 0813 Oral     SpO2 06/13/17 0813 99 %     Weight 06/13/17 0815 137 lb (62.1 kg)     Height 06/13/17 0815  (1.676 m)     Head Circumference --      Peak Flow --      Pain Score 06/13/17 0815 0     Pain Loc --      Pain Edu? --      Excl. in GC? --    No data found.  Updated Vital Signs BP 100/67 (BP Location: Left Arm)   Pulse 86   Temp 98.5 F (36.9 C) (Oral)   Resp 18   Ht  (1.676 m)   Wt 137 lb (62.1 kg)   SpO2 99%   BMI 22.11 kg/m   Visual Acuity Right Eye Distance:   Left Eye Distance:   Bilateral Distance:    Right Eye Near:   Left Eye Near:    Bilateral Near:     Physical Exam  Constitutional: She is oriented to person, place, and time. She appears well-developed and well-nourished.  Neck: Normal range of motion. Neck supple.  Cardiovascular: Normal rate and regular rhythm.  Pulmonary/Chest: Effort normal and breath sounds normal.  Abdominal: Soft. Normal appearance and bowel sounds are normal. There is no tenderness. There is no CVA tenderness.  Musculoskeletal: Normal range of motion.  Neurological: She is alert and oriented to person, place, and time.  Skin: Skin is warm and dry.  Psychiatric: She has a normal mood and affect. Her behavior is normal.     UC Treatments / Results  Labs (all labs ordered are listed, but only abnormal results are displayed) Labs Reviewed  COMPREHENSIVE METABOLIC PANEL - Abnormal; Notable for the following components:      Result Value   Glucose, Bld 118 (*)    Calcium 8.6 (*)    Albumin 3.4 (*)    All other components within normal limits  C DIFFICILE  QUICK SCREEN W PCR REFLEX    EKG None  Radiology No results found.  Procedures Procedures (including critical care time)  Medications Ordered in UC Medications - No data to display  Initial Impression / Assessment and Plan / UC Course  I have reviewed the triage vital signs and the nursing notes.  Pertinent labs &  imaging results that were available during my care of the patient were reviewed by me and considered in my medical decision making (see chart for details).     57 year old female with a one-week history of diarrhea, abdominal pain, nausea, fatigue and weight loss.  Onset of symptoms about 1 hour after eating a hot dog at a restaurant.  She is also been on Augmentin recently for URI.  No prior evaluation.  Eating and drinking okay.  Imodium providing minimal relief.  CMP unremarkable.  Stool for C. difficile pending.  Antibiotic treatment for this will depend on the results. Supportive care advised for now Patient is well-hydrated and nontoxic-appearing.  Vital signs stable.  Afebrile.  Plan:  1. Clear liquids/BRAT diet  2. Prescribed therapies: Bentyl, Lomtil and Zofran  3. Discussed the appropriate management of diarrhea. 4. Follow up with PCP on Monday   Evaluation has revealed no signs of a dangerous process. Discussed diagnosis with patient. Patient aware of their illness, possible red flag symptoms to watch out for and need for close follow up. Patient understands verbal and written discharge instructions. Patient comfortable with plan and disposition.  Patient has a clear mental status at this time, good insight into illness (after discussion and teaching) and has clear judgment to make decisions regarding their care.  Documentation was completed with the aid of voice recognition software. Transcription may contain typographical errors.  Final Clinical Impressions(s) / UC Diagnoses   Final diagnoses:  Diarrhea, unspecified type     Discharge Instructions       Your stool has been tested for an infection called C. Difficile. Results should be available in the next 24-48 hours. Antibiotic treatment for this will depend on the results. Supportive care advised for now. Take medications as prescribed. Drink plenty of fluids to stay hydrated. Continue BRAT/bland diet. Follow-up with your primary care provider on Monday. Go to the ED immediately if you have severe abdominal pain, persistent vomiting, fevers, unable to keep anything down or any other concerns.     ED Prescriptions    Medication Sig Dispense Auth. Provider   dicyclomine (BENTYL) 20 MG tablet Take 1 tablet (20 mg total) by mouth 2 (two) times daily. 20 tablet Lurline Idol, FNP   diphenoxylate-atropine (LOMOTIL) 2.5-0.025 MG tablet Take 2 tablets by mouth 4 (four) times daily as needed for diarrhea or loose stools. 30 tablet Lurline Idol, FNP   ondansetron (ZOFRAN ODT) 8 MG disintegrating tablet Take 1 tablet (8 mg total) by mouth every 8 (eight) hours as needed for nausea or vomiting. 20 tablet Lurline Idol, FNP     Controlled Substance Prescriptions [place] Controlled Substance Registry consulted? Not Applicable   Lurline Idol, FNP 06/13/17 0930

## 2017-06-14 ENCOUNTER — Other Ambulatory Visit: Payer: Self-pay | Admitting: Internal Medicine

## 2017-06-14 DIAGNOSIS — G8929 Other chronic pain: Secondary | ICD-10-CM

## 2017-06-14 DIAGNOSIS — M5440 Lumbago with sciatica, unspecified side: Principal | ICD-10-CM

## 2017-06-18 ENCOUNTER — Telehealth: Payer: Self-pay | Admitting: Emergency Medicine

## 2017-06-18 NOTE — Telephone Encounter (Signed)
Patient called for results of C-diff. Patient notified of negative results and to follow up with PCP if she continues to have symptoms. Patient verbalized understanding.

## 2017-08-13 ENCOUNTER — Other Ambulatory Visit: Payer: Self-pay

## 2017-08-13 DIAGNOSIS — G8929 Other chronic pain: Secondary | ICD-10-CM

## 2017-08-13 DIAGNOSIS — M5440 Lumbago with sciatica, unspecified side: Principal | ICD-10-CM

## 2017-08-13 MED ORDER — GABAPENTIN 300 MG PO CAPS: 300.0000 mg | ORAL_CAPSULE | Freq: Two times a day (BID) | ORAL | 0 refills | Status: DC

## 2017-09-27 ENCOUNTER — Encounter: Payer: Self-pay | Admitting: Emergency Medicine

## 2017-09-27 ENCOUNTER — Ambulatory Visit
Admission: EM | Admit: 2017-09-27 | Discharge: 2017-09-27 | Disposition: A | Discharge status: Home / Self Care | Payer: 59 | Attending: Family Medicine | Admitting: Family Medicine

## 2017-09-27 ENCOUNTER — Other Ambulatory Visit: Payer: Self-pay

## 2017-09-27 DIAGNOSIS — S39012A Strain of muscle, fascia and tendon of lower back, initial encounter: Secondary | ICD-10-CM | POA: Diagnosis not present

## 2017-09-27 DIAGNOSIS — M5432 Sciatica, left side: Secondary | ICD-10-CM

## 2017-09-27 MED ORDER — METAXALONE 800 MG PO TABS: 800.0000 mg | ORAL_TABLET | Freq: Three times a day (TID) | ORAL | 0 refills | Status: DC

## 2017-09-27 MED ORDER — KETOROLAC TROMETHAMINE 60 MG/2ML IM SOLN
60.0000 mg | Freq: Once | INTRAMUSCULAR | Status: AC
  Administered 2017-09-27: 60 mg via INTRAMUSCULAR

## 2017-09-27 MED ORDER — MELOXICAM 15 MG PO TABS: 15.0000 mg | ORAL_TABLET | Freq: Every day | ORAL | 0 refills | Status: DC

## 2017-09-27 NOTE — ED Triage Notes (Signed)
Patient c/oleft sided lower back pain after moving on Tuesday.  Patient states that when she puts wait on her left foot the pain shoots up into her back.

## 2017-09-27 NOTE — Discharge Instructions (Signed)
Apply ice 20 minutes out of every 2 hours 4-5 times daily for comfort. Use  Caution while taking muscle relaxers.  Do not perform activities requiring concentration or judgment and do not drive.  °

## 2017-09-27 NOTE — ED Provider Notes (Signed)
[facility]    CSN: [id number] Arrival date & time: 09/27/17  0820     History   Chief Complaint Chief Complaint  Patient presents with  . Back Pain    APP    HPI Jenna Estrada is a 57 y.o. female.   HPI  57 year old female presents with left-sided lower back pain with radiation to her left posterior thigh.  She states that she recently moved into a new apartment; all of the lifting pushing and pulling has "caught up with her".  Whenever she puts weight on her left side the pain will shoot up into her back.  Denies any numbness or tingling.  Does have previous history of sciatic nerve pain from low back injuries dating back to the 77s when she was in the [employer].  No incontinence.  Has been taking gabapentin which she uses only when she has these issues but has not been working well for her and neither has the Motrin.        Past Medical History:  Diagnosis Date  . Anxiety   . Bipolar 1 disorder (HCC)   . Sciatic nerve pain, left    taking gabapentin.    Patient Active Problem List   Diagnosis Date Noted  . Bipolar 1 disorder (HCC) 10/31/2016  . Chronic low back pain with sciatica 10/31/2016  . Allergic cough 10/31/2016  . Hx of colonic polyps 10/31/2016    Past Surgical History:  Procedure Laterality Date  . ABDOMINAL HYSTERECTOMY  2008   fibroids, ovaries remain  . APPENDECTOMY     1971  . COLONOSCOPY  10/2014 and 06/17/2015   Every 3 yr due to incomplete prep  . HERNIA REPAIR    . 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 TONSILLECTOMY    . WRIST FRACTURE SURGERY     plate put in right wrist     OB History   None      Home Medications    Prior to Admission medications   Medication Sig Start Date End Date Taking? Authorizing Provider  buPROPion (WELLBUTRIN XL) 300 MG 24 hr tablet Take 300 mg by mouth daily.   Yes [provider]  DULoxetine (CYMBALTA) 60 MG capsule Take 60 mg by mouth daily.   Yes [provider]  gabapentin (NEURONTIN) 300 MG capsule Take 1 capsule (300 mg total) by mouth 2 (two) times daily. 08/13/17  Yes Reubin Milan, MD  ARIPiprazole (ABILIFY) 5 MG tablet aripiprazole 5 mg tablet    [provider]  L-methylfolate Calcium 15 MG TABS TK 1 T PO D 12/19/16   [provider]  LamoTRIgine 100 MG TBDP Take 200 mg by mouth daily.    [provider]  meloxicam (MOBIC) 15 MG tablet Take 1 tablet (15 mg total) by mouth daily. 09/27/17   Lutricia Feil, PA-C  metaxalone (SKELAXIN) 800 MG tablet Take 1 tablet (800 mg total) by mouth 3 (three) times daily. 09/27/17   Lutricia Feil, PA-C    Family History Family History  Problem Relation Age of Onset  . Hypertension Mother   . Rectal cancer Father   . COPD Father   . Asthma Father   . Breast cancer Neg Hx     Social History Social History   Tobacco Use  . Smoking status: Never Smoker  . Smokeless tobacco: Never Used  Substance Use Topics  . Alcohol use: No  . Drug use: No  Allergies   Patient has no known allergies.   Review of Systems Review of Systems  Constitutional: Positive for activity change. Negative for appetite change, chills, fatigue and fever.  Musculoskeletal: Positive for back pain and myalgias.  All other systems reviewed and are negative.    Physical Exam Triage Vital Signs ED Triage Vitals  Enc Vitals Group     BP 09/27/17 0830 136/70     Pulse Rate 09/27/17 0830 66     Resp 09/27/17 0830 14     Temp 09/27/17 0830 97.6 F (36.4 C)     Temp Source 09/27/17 0830 Oral     SpO2 09/27/17 0830 100 %     Weight 09/27/17 0826 150 lb (68 kg)     Height 09/27/17 0826 5\' 6"  (1.676 m)     Head Circumference --      Peak Flow --      Pain Score 09/27/17 0826 8     Pain Loc --      Pain Edu? --      Excl. in GC? --    No data found.  Updated Vital Signs BP 136/70 (BP Location: Left Arm)   Pulse 66   Temp 97.6 F (36.4 C) (Oral)   Resp 14   Ht 5\' 6"  (1.676  m)   Wt 150 lb (68 kg)   SpO2 100%   BMI 24.21 kg/m   Visual Acuity Right Eye Distance:   Left Eye Distance:   Bilateral Distance:    Right Eye Near:   Left Eye Near:    Bilateral Near:     Physical Exam  Constitutional: She is oriented to person, place, and time. She appears well-developed and well-nourished. No distress.  HENT:  Head: Normocephalic.  Eyes: Pupils are equal, round, and reactive to light. Right eye exhibits no discharge. Left eye exhibits no discharge.  Neck: Normal range of motion.  Musculoskeletal: She exhibits tenderness.  Patient has a level pelvis in stance.  To forward flex with her hands level of her knee but returning to upright posture is more difficult.  Leftward flexion is uncomfortable rightward flexion is normal.  Patient is tender over the left sacroiliac notch and also in the adjacent paraspinous muscles on the left just superior to the sacroiliac joint.  This reproduces her symptoms.  Neurological: She is alert and oriented to person, place, and time.  Skin: Skin is warm and dry. She is not diaphoretic.  Psychiatric: She has a normal mood and affect. Her behavior is normal. Judgment and thought content normal.  Nursing note and vitals reviewed.    UC Treatments / Results  Labs (all labs ordered are listed, but only abnormal results are displayed) Labs Reviewed - No data to display  EKG None  Radiology No results found.  Procedures Procedures (including critical care time)  Medications Ordered in UC Medications  ketorolac (TORADOL) injection 60 mg (60 mg Intramuscular Given 09/27/17 0847)    Initial Impression / Assessment and Plan / UC Course  I have reviewed the triage vital signs and the nursing notes.  Pertinent labs & imaging results that were available during my care of the patient were reviewed by me and considered in my medical decision making (see chart for details).     Since This patient has had the injury to her back  for quite sometime ,she is well versed and I have given her the particulars to avoid further injury.  I have reminded her to not  sit lift or push.  She should use ice initially and switch to a heat and alternate with ice later.  I have offered her a Toradol shot which she accepted.  Should begin using the Motrin with either lunch or dinner to her convenience.  I have given her scription for Skelaxin with appropriate precautions.  If she is not improving she should follow-up with her primary care provider. Final Clinical Impressions(s) / UC Diagnoses   Final diagnoses:  Sciatica of left side  Strain of lumbar region, initial encounter     Discharge Instructions     Apply ice 20 minutes out of every 2 hours 4-5 times daily for comfort. Use  Caution while taking muscle relaxers.  Do not perform activities requiring concentration or judgment and do not drive.    ED Prescriptions    Medication Sig Dispense Auth. Provider   meloxicam (MOBIC) 15 MG tablet Take 1 tablet (15 mg total) by mouth daily. 30 tablet Lutricia Feil, PA-C   metaxalone (SKELAXIN) 800 MG tablet Take 1 tablet (800 mg total) by mouth 3 (three) times daily. 21 tablet Lutricia Feil, PA-C     Controlled Substance Prescriptions [place] Controlled Substance Registry consulted? Not Applicable   Lutricia Feil, PA-C 09/27/17 1610

## 2017-11-11 ENCOUNTER — Other Ambulatory Visit: Payer: Self-pay | Admitting: Internal Medicine

## 2017-11-11 DIAGNOSIS — G8929 Other chronic pain: Secondary | ICD-10-CM

## 2017-11-11 DIAGNOSIS — M5440 Lumbago with sciatica, unspecified side: Principal | ICD-10-CM

## 2017-11-30 ENCOUNTER — Ambulatory Visit
Admission: EM | Admit: 2017-11-30 | Discharge: 2017-11-30 | Disposition: A | Discharge status: Home / Self Care | Payer: 59 | Attending: Family Medicine | Admitting: Family Medicine

## 2017-11-30 DIAGNOSIS — M62838 Other muscle spasm: Secondary | ICD-10-CM | POA: Diagnosis not present

## 2017-11-30 DIAGNOSIS — M898X1 Other specified disorders of bone, shoulder: Secondary | ICD-10-CM

## 2017-11-30 DIAGNOSIS — M5442 Lumbago with sciatica, left side: Secondary | ICD-10-CM | POA: Diagnosis not present

## 2017-11-30 DIAGNOSIS — G8929 Other chronic pain: Secondary | ICD-10-CM

## 2017-11-30 MED ORDER — TIZANIDINE HCL 4 MG PO TABS: 4.0000 mg | ORAL_TABLET | Freq: Four times a day (QID) | ORAL | 0 refills | Status: DC | PRN

## 2017-11-30 MED ORDER — MELOXICAM 15 MG PO TABS: 15.0000 mg | ORAL_TABLET | Freq: Every day | ORAL | 0 refills | Status: DC | PRN
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
## 2017-11-30 NOTE — Discharge Instructions (Signed)
Rest.  Heat.  Medication as prescribed.  [facility]  Take care  Dr. [name]
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
## 2017-12-04 ENCOUNTER — Other Ambulatory Visit: Payer: Self-pay | Admitting: Family Medicine

## 2017-12-08 ENCOUNTER — Other Ambulatory Visit: Payer: Self-pay | Admitting: Internal Medicine

## 2017-12-13 ENCOUNTER — Other Ambulatory Visit: Payer: Self-pay | Admitting: Internal Medicine

## 2017-12-19 ENCOUNTER — Ambulatory Visit
Admission: RE | Admit: 2017-12-19 | Discharge: 2017-12-19 | Disposition: A | Discharge status: Home / Self Care | Payer: 59 | Source: Ambulatory Visit | Attending: Internal Medicine | Admitting: Internal Medicine

## 2017-12-19 DIAGNOSIS — Z1239 Encounter for other screening for malignant neoplasm of breast: Secondary | ICD-10-CM | POA: Diagnosis not present

## 2017-12-27 ENCOUNTER — Encounter: Payer: Self-pay | Admitting: Internal Medicine

## 2017-12-27 ENCOUNTER — Ambulatory Visit (INDEPENDENT_AMBULATORY_CARE_PROVIDER_SITE_OTHER): Payer: 59 | Admitting: Internal Medicine

## 2017-12-27 ENCOUNTER — Other Ambulatory Visit: Payer: Self-pay | Admitting: Family Medicine

## 2017-12-27 ENCOUNTER — Ambulatory Visit: Payer: 59 | Admitting: Internal Medicine

## 2017-12-27 VITALS — BP 118/78 | HR 78 | Resp 16 | Ht 66.0 in | Wt 167.0 lb

## 2017-12-27 DIAGNOSIS — J40 Bronchitis, not specified as acute or chronic: Secondary | ICD-10-CM

## 2017-12-27 DIAGNOSIS — F319 Bipolar disorder, unspecified: Secondary | ICD-10-CM

## 2017-12-27 MED ORDER — HYDROCOD POLST-CPM POLST ER 10-8 MG/5ML PO SUER: 5.0000 mL | Freq: Two times a day (BID) | ORAL | 0 refills | Status: DC

## 2017-12-27 NOTE — Progress Notes (Signed)
Date:  12/27/2017   Name:  Jenna Estrada   DOB:  05/01/1960   MRN:  [id number]   Chief Complaint: Cough (1 week of coughing and cold. Interupting sleep and wants syrup. )  Cough  This is a new problem. The current episode started in the past 7 days. The problem has been unchanged. The problem occurs every few minutes. The cough is non-productive. Associated symptoms include postnasal drip. Pertinent negatives include no chest pain, chills, fever, shortness of breath or wheezing. The symptoms are aggravated by lying down and exercise. She has tried OTC cough suppressant for the symptoms. The treatment provided no relief.    Review of Systems  Constitutional: Negative for chills and fever.  HENT: Positive for postnasal drip.   Respiratory: Positive for cough. Negative for chest tightness, shortness of breath and wheezing.   Cardiovascular: Negative for chest pain and palpitations.    Patient Active Problem List   Diagnosis Date Noted  . Bipolar 1 disorder (HCC) 10/31/2016  . Chronic low back pain with sciatica 10/31/2016  . Allergic cough 10/31/2016  . Hx of colonic polyps 10/31/2016    No Known Allergies  Past Surgical History:  Procedure Laterality Date  . ABDOMINAL HYSTERECTOMY  2008   fibroids, ovaries remain  . APPENDECTOMY     1971  . COLONOSCOPY  10/2014 and 06/17/2015   Every 3 yr due to incomplete prep  . HERNIA REPAIR    . 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 TONSILLECTOMY    . WRIST FRACTURE SURGERY     plate put in right wrist     Social History   Tobacco Use  . Smoking status: Never Smoker  . Smokeless tobacco: Never Used  Substance Use Topics  . Alcohol use: No  . Drug use: No     Medication list has been reviewed and updated.  Current Meds  Medication Sig  . ARIPiprazole (ABILIFY) 5 MG tablet 2.5 mg.   . buPROPion (WELLBUTRIN XL) 300 MG 24 hr tablet Take 300 mg by mouth daily.  . DULoxetine (CYMBALTA) 60 MG capsule Take 60 mg by  mouth daily.  . L-methylfolate Calcium 15 MG TABS TK 1 T PO D  . LamoTRIgine 100 MG TBDP Take 200 mg by mouth daily.  . [DISCONTINUED] tiZANidine (ZANAFLEX) 4 MG tablet TAKE 1 TABLET(4 MG) BY MOUTH EVERY 6 HOURS AS NEEDED FOR MUSCLE SPASMS    PHQ 2/9 Scores 05/22/2017 10/31/2016  PHQ - 2 Score 2 1  PHQ- 9 Score 4 -    Physical Exam Vitals signs and nursing note reviewed.  Constitutional:      General: She is not in acute distress.    Appearance: She is well-developed.  HENT:     Head: Normocephalic and atraumatic.     Nose:     Right Sinus: No maxillary sinus tenderness.     Left Sinus: No maxillary sinus tenderness.     Mouth/Throat:     Mouth: Mucous membranes are moist.  Cardiovascular:     Rate and Rhythm: Normal rate and regular rhythm.  Pulmonary:     Effort: Pulmonary effort is normal. No respiratory distress.     Breath sounds: Transmitted upper airway sounds present. No wheezing or rhonchi.  Musculoskeletal: Normal range of motion.  Skin:    General: Skin is warm and dry.     Findings: No rash.  Neurological:     Mental Status: She is alert and  oriented to person, place, and time.  Psychiatric:        Behavior: Behavior normal.        Thought Content: Thought content normal.     BP 118/78   Pulse 78   Resp 16   Ht 5\' 6"  (1.676 m)   Wt 167 lb (75.8 kg)   SpO2 98%   BMI 26.95 kg/m   Assessment and Plan: 1. Bronchitis Continue conservative care tussionex at night - chlorpheniramine-HYDROcodone (TUSSIONEX PENNKINETIC ER) 10-8 MG/5ML SUER; Take 5 mLs by mouth 2 (two) times daily.  Dispense: 230 mL; Refill: 0  2. Bipolar 1 disorder (HCC) Followed by Psych at [facility]   Partially dictated using [employer]. Any errors are unintentional.  Bari Edward, MD [facility]  12/27/2017

## 2018-01-13 ENCOUNTER — Encounter: Payer: Self-pay | Admitting: Internal Medicine

## 2018-01-13 ENCOUNTER — Ambulatory Visit (INDEPENDENT_AMBULATORY_CARE_PROVIDER_SITE_OTHER): Payer: 59 | Admitting: Internal Medicine

## 2018-01-13 VITALS — BP 128/76 | HR 84 | Temp 98.5°F | Ht 66.0 in | Wt 167.0 lb

## 2018-01-13 DIAGNOSIS — J0100 Acute maxillary sinusitis, unspecified: Secondary | ICD-10-CM

## 2018-01-13 DIAGNOSIS — M545 Low back pain, unspecified: Secondary | ICD-10-CM

## 2018-01-13 MED ORDER — AZITHROMYCIN 250 MG PO TABS: ORAL_TABLET | ORAL | 0 refills | Status: AC

## 2018-01-13 MED ORDER — TIZANIDINE HCL 4 MG PO TABS: 4.0000 mg | ORAL_TABLET | Freq: Three times a day (TID) | ORAL | 2 refills | Status: DC | PRN

## 2018-01-13 NOTE — Progress Notes (Signed)
Date:  01/13/2018   Name:  Jenna Estrada   DOB:  08/06/1960   MRN:  [id number]   Chief Complaint: Cough (Off and on for 3 weeks. Seen us for couhg previously. Still coughing, has headache with running nose. Cough is worse at night. Body aches. Daughter was sick on Christmas. Right ear is hurting and throat is hurting.  ) and Back Pain (Was [facility] Met[facility]alone [facility]ad[facility] [place]. Uses only as needed and wanted to know if she could have refill since they help with back pain. )  Back Pain  This is a recurrent problem. The problem occurs intermittently. The problem is unchanged. The pain is present in the lumbar spine. The pain radiates to the left thigh. The pain is mild. The symptoms are aggravated by twisting and bending. Pertinent negatives include no chest pain, fever, numbness, pelvic pain or weakness. She has tried muscle relaxant and analgesics for the symptoms. The treatment provided moderate relief.  Sinus Problem  This is a new problem. The current episode started in the past 7 days. The problem is unchanged. There has been no fever. The pain is mild. Associated symptoms include congestion, sinus pressure and a sore throat. Pertinent negatives include no chills, coughing or shortness of breath. The treatment provided mild relief.    Review of Systems  Constitutional: Positive for fatigue. Negative for chills and fever.  HENT: Positive for congestion, postnasal drip, sinus pressure and sore throat.   Respiratory: Negative for cough, chest tightness, shortness of breath and wheezing.   Cardiovascular: Negative for chest pain and palpitations.  Genitourinary: Negative for pelvic pain.  Musculoskeletal: Positive for back pain.  Neurological: Negative for weakness and numbness.    Patient Active Problem List   Diagnosis Date Noted  . Bipolar 1 disorder (HCC) 10/31/2016  . Chronic low back pain with sciatica 10/31/2016  . Allergic cough 10/31/2016  . Hx  of colonic polyps 10/31/2016    No Known Allergies  Past Surgical History:  Procedure Laterality Date  . ABDOMINAL HYSTERECTOMY  2008   fibroids, ovaries remain  . APPENDECTOMY     1971  . COLONOSCOPY  10/2014 and 06/17/2015   Every 3 yr due to incomplete prep  . HERNIA REPAIR    . 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 TONSILLECTOMY    . WRIST FRACTURE SURGERY     plate put in right wrist     Social History   Tobacco Use  . Smoking status: Never Smoker  . Smokeless tobacco: Never Used  Substance Use Topics  . Alcohol use: No  . Drug use: No     Medication list has been reviewed and updated.  Current Meds  Medication Sig  . ARIPiprazole (ABILIFY) 5 MG tablet 2.5 mg.   . buPROPion (WELLBUTRIN XL) 300 MG 24 hr tablet Take 300 mg by mouth daily.  . DULoxetine (CYMBALTA) 60 MG capsule Take 60 mg by mouth daily.  Marland Kitchen. gabapentin (NEURONTIN) 300 MG capsule TAKE 1 CAPSULE TWICE DAILY (Patient taking differently: 600 mg 2 (two) times daily. )  . L-methylfolate Calcium 15 MG TABS TK 1 T PO D  . LamoTRIgine 100 MG TBDP Take 200 mg by mouth daily.  . meloxicam (MOBIC) 15 MG tablet Take 1 tablet (15 mg total) by mouth daily as needed.    PHQ 2/9 Scores 05/22/2017 10/31/2016  PHQ - 2 Score 2 1  PHQ- 9 Score 4 -  Physical Exam Constitutional:      Appearance: She is well-developed.  HENT:     Right Ear: Ear canal and external ear normal. Tympanic membrane is not erythematous or retracted.     Left Ear: Ear canal and external ear normal. Tympanic membrane is not erythematous or retracted.     Nose:     Right Sinus: Maxillary sinus tenderness and frontal sinus tenderness present.     Left Sinus: Maxillary sinus tenderness and frontal sinus tenderness present.     Mouth/Throat:     Mouth: No oral lesions.     Pharynx: Uvula midline. Posterior oropharyngeal erythema present. No oropharyngeal exudate.  Cardiovascular:     Rate and Rhythm: Normal rate and regular rhythm.      Heart sounds: Normal heart sounds.  Pulmonary:     Breath sounds: Normal breath sounds. No wheezing or rales.  Musculoskeletal:       Arms:  Lymphadenopathy:     Cervical: No cervical adenopathy.  Neurological:     Mental Status: She is alert and oriented to person, place, and time.     BP 128/76 (BP Location: Right Arm, Patient Position: Sitting, Cuff Size: Normal)   Pulse 84   Temp 98.5 F (36.9 C) (Oral) Comment: Took motrin this morning at 10am  Ht 5\' 6"  (1.676 m)   Wt 167 lb (75.8 kg)   SpO2 96%   BMI 26.95 kg/m   Assessment and Plan: 1. Acute non-recurrent maxillary sinusitis Continue over the counter sinus medications - azithromycin (ZITHROMAX Z-PAK) 250 MG tablet; UAD  Dispense: 6 each; Refill: 0  2. Left-sided low back pain without sciatica, unspecified chronicity Continue heat, Advil as needed Will d/c mobic as not beneficial - tiZANidine (ZANAFLEX) 4 MG tablet; Take 1 tablet (4 mg total) by mouth every 8 (eight) hours as needed for muscle spasms.  Dispense: 30 tablet; Refill: 2   Partially dictated using [employer] software. Any errors are unintentional.  Bari EdwardLaura Catrena Vari, MD [facility]  01/13/2018

## 2018-01-22 ENCOUNTER — Telehealth: Payer: Self-pay

## 2018-01-22 ENCOUNTER — Other Ambulatory Visit: Payer: Self-pay | Admitting: Internal Medicine

## 2018-01-22 DIAGNOSIS — J0100 Acute maxillary sinusitis, unspecified: Secondary | ICD-10-CM

## 2018-01-22 MED ORDER — AMOXICILLIN-POT CLAVULANATE 875-125 MG PO TABS: 1.0000 | ORAL_TABLET | Freq: Two times a day (BID) | ORAL | 0 refills | Status: AC

## 2018-01-22 NOTE — Telephone Encounter (Signed)
Patient called stating she was seen on the 30th for sinus infection. She said we sent in Zpack for her but she now has a earache on top of still coughing. Wants to know if we can send in another round of Zpack or send in stronger abx.  Please Advise.

## 2018-01-22 NOTE — Telephone Encounter (Signed)
Patient informed of VM of Rx being sent. Told to call with any future questions.

## 2018-01-22 NOTE — Telephone Encounter (Signed)
Sent Augmentin to pharmacy.  

## 2018-01-30 ENCOUNTER — Ambulatory Visit: Payer: No Typology Code available for payment source | Attending: Family Medicine | Admitting: Physical Therapy

## 2018-01-30 ENCOUNTER — Encounter: Payer: Self-pay | Admitting: Physical Therapy

## 2018-01-30 ENCOUNTER — Other Ambulatory Visit: Payer: Self-pay

## 2018-01-30 DIAGNOSIS — G8929 Other chronic pain: Secondary | ICD-10-CM | POA: Insufficient documentation

## 2018-01-30 DIAGNOSIS — R1032 Left lower quadrant pain: Secondary | ICD-10-CM | POA: Insufficient documentation

## 2018-01-30 DIAGNOSIS — M6281 Muscle weakness (generalized): Secondary | ICD-10-CM | POA: Insufficient documentation

## 2018-01-30 DIAGNOSIS — M545 Low back pain, unspecified: Secondary | ICD-10-CM

## 2018-01-30 DIAGNOSIS — N3946 Mixed incontinence: Secondary | ICD-10-CM | POA: Insufficient documentation

## 2018-01-30 DIAGNOSIS — R278 Other lack of coordination: Secondary | ICD-10-CM | POA: Insufficient documentation

## 2018-01-30 DIAGNOSIS — R293 Abnormal posture: Secondary | ICD-10-CM | POA: Diagnosis present

## 2018-01-31 NOTE — Therapy (Signed)
[facility] [address] [place], [place], [address] Phone: [phone]   Fax:  [phone]  Physical Therapy Evaluation  Patient Details  Name: Jenna PerlSiegrid Marie Wieneke MRN: [id number] Date of Birth: 08/15/1960 Referring Provider (PT): Bari EdwardLaura Berglund, MD   Encounter Date: 01/30/2018  PT End of Session - 01/30/18 1728    Visit Number  1    Number of Visits  13    Date for PT Re-Evaluation  04/24/18    PT Start Time  1734    PT Stop Time  1837    PT Time Calculation (min)  63 min    Activity Tolerance  Patient tolerated treatment well    Behavior During Therapy  [facility] for tasks assessed/performed       Past Medical History:  Diagnosis Date  . Anxiety   . Bipolar 1 disorder (HCC)   . Sciatic nerve pain, left    taking gabapentin.    Past Surgical History:  Procedure Laterality Date  . ABDOMINAL HYSTERECTOMY  2008   fibroids, ovaries remain  . APPENDECTOMY     1971  . COLONOSCOPY  10/2014 and 06/17/2015   Every 3 yr due to incomplete prep  . HERNIA REPAIR    . 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 TONSILLECTOMY    . WRIST FRACTURE SURGERY     plate put in right wrist     There were no vitals filed for this visit.   Subjective Assessment - 01/31/18 1049    Subjective  Onset? Back pain started 36 years ago and is the patient's priority. Sciatica in the L leg only and that's been progressing for the past 3 years, with radiating symptoms from back to glutes to back of thigh and wrapping around the front of thigh to the calf and foot with numbness/tingling.  Sitting limit is about 2 hours. Standing 2 hours. Walking is unlimited. Patient feels need to shift the spine a lot when stationary. Recurrent hx of sinus infections starting with sore throat and cough. Hx of hysterectomy with mesh and mesh is still present. Vocation: part-time Conservation officer, naturecashier. Pain with hygiene after BM 2/2 to reaching and twisting. Leakage triggers?  Mainly sneezing (100% of the time), coughing (90% of the time), laughing (20% of the time). Bladder habits? Work shift 6-7 hours, 1 bathroom break. 4-5x/ day otherwise # of pads and type? menstrual pad regular at night when there is a cough. 1x Amount of leakage? Moderate, enough to wet the pants Bowel movements? Type 3/4. Patient always feels a little constipated. No pain with BM. Nocturia? 1-3x Fluid intake? Coffee 12 oz.; H20 90-100 oz. Pain? w/ intercourse? L sided pain previously when sexually active. w/ gynecological exam? L sided abdominal pain. What is the incontinence in the way of in your day-to-day? Fear of certain movements and fear of improper body mechanics. Spending time with granddaughter.    Pertinent History  G3, P1 (vaginal delivery, no report of tearing); abdominal hysterectomy, persistent L sided abdominal pain, fear of movement    Limitations  Lifting;Standing;Walking;Sitting;House hold activities    Patient Stated Goals  Feel better, learn techniques, strengthen the back, reduce night pain.    Currently in Pain?  Yes    Pain Score  6     Pain Location  Back    Pain Orientation  Left;Lower    Pain Descriptors / Indicators  Aching    Pain Type  Chronic pain  Pain Radiating Towards  LLE and foot    Pain Onset  More than a month ago    Pain Frequency  Constant    Multiple Pain Sites  Yes    Pain Score  5    Pain Location  Abdomen    Pain Orientation  Left;Lateral;Lower    Pain Descriptors / Indicators  Cramping    Pain Type  Chronic pain    Pain Onset  More than a month ago    Pain Frequency  Intermittent         [facility] PT Assessment - 01/31/18 0001      Assessment   Medical Diagnosis  Mixed Urinary Incontinence and LBP    Referring Provider (PT)  Bari Edward, MD    Onset Date/Surgical Date  01/31/81    Hand Dominance  Right    Next MD Visit  05/2018    Prior Therapy  for back pain      Precautions   Precautions  None      Restrictions   Weight Bearing  Restrictions  No      Balance Screen   Has the patient fallen in the past 6 months  No    Has the patient had a decrease in activity level because of a fear of falling?   Yes      Home Environment   Living Environment  Private residence    Living Arrangements  Alone    Type of Home  Apartment    Home Layout  One level      Prior Function   Level of Independence  Independent    Vocation  Part time employment;Retired      Copy Status  Within Functional Limits for tasks assessed       Pelvic Floor Physical Therapy Evaluation and Assessment  Current pain:  6/10  Max pain:  9/10 Least pain:  5/10   OBJECTIVE  Posture/Observations:  L PSIS posterior, elevated L forward rotation  Palpation/Segmental Motion/Joint Play: CPAs T8-L5, stiff, non-painful L UPAs T8- L5, stiff, painful L2-L5 R UPAs T8-L5, stiff, non-painful  Coccyx is unremarkable. Lumbar paraspinals have increased resting tone and L has increased guarding and pain response to light touch. Psoas is painful and concordant B, L>R. Unable to fully assess on L side due to increased guarding and pain response. L gluteal region, glute med is tender and concordant. Adductors B have increased resting tone and are highly tender to palpation, L>R. L along adductors from origin to insertion. R side pain is more proximal toward the origin.   Special tests:   Slump test (-) B SLR (-) B Ely's (+) B  Range of Motion/Flexibilty:  Spine:  Lumbar flexion limited and painful Lumbar extension relieves pain Lumbar lateral flexion 61cm R and painful Lumbar lateral flexion 55 cm L  Lumbar L rotation 50% of R Lumbar R rotation is pain relieving  Hips: SLR hamstring length: R ~80 degrees, brings on L sided back pain SLR hamstring length: L ~65 degrees, brings on L sided back pain   Strength/MMT:  LE MMT  LE MMT Left Right  Hip flex:  (L2) 3+/5 4/5  Hip ext: 3/5 3/5  Hip abd (seated): 3+/5 3+/5   Hip add (seated): 3+/5 3+/5  Knee flexion 3+/5 5/5  Knee extension 3+/5 5/5  Dorsiflexion 3+/5 4/5     Abdominal:  Palpation: Grossly unremarkable; L lower quadrant is highly tender to light touch and palpation. Diastasis: 1.5-2  finger width at all 3 measurement sites.  Pelvic Floor External Exam: Palpation: no tenderness to palpation of superficial PFM,  Cough: PFM lengthens with cued cough; concordant of report of leakage with coughing/sneezing Patient able to voluntarily lengthen with verbal cue. Patient able to squeeze and lift with verbal cue; unable to provide MMT grade for contraction due to external test.  Internal Vaginal Exam: deferred on this date Strength (PERF):  Symmetry: Palpation: Prolapse:   Gait Analysis: Gait is largely unremarkable. Patient has slight L lateral lean and L rotation.   Pelvic Floor Outcome Measures: PFIQ-7 (POPIQ-7: 85.7%)  ASSESSMENT Patient is a pleasant 58 year old female presenting to clinic with chief complaints of chronic back pain with radiating symptoms and stress urinary incontinence symptoms. PT examination revealed deficits in pain (worst 9/10), strength (LLE grossly 3+/5), spine ROM (L rotation 50% of R, R lumbar lateral flexion painful and limited, lumbar flexion painful and limited), flexibility (B hamstrings, B hip flexors, B adductors; L deficits and pain>R), postural deficits, lack of coordination of PFM and mismanagement of intra-abdominal pressures under physical stress, all of which are negatively impacting her ability to function and participate in work and life demands. Patient's progress may be limited due to the chronicity of the symptoms, fear avoidance behaviors, and repetitive loading demands from her vocation; however, the patient demonstrates excellent motivation. Patient will benefit from skilled therapeutic intervention to address the aforementioned deficits in order to decrease symptoms and improve overall  QOL.   INTERVENTIONS THIS SESSION: Neuromuscular Re-education Seated diaphragmatic breathing with TCs and VCs for decreased reliance on accessory breathing muscles and to encourage coordination of diaphragm with PFM for management of intra-abdominal pressure.  Seated L Side Stretch with OH reach with VCs for improved posture.   Objective measurements completed on examination: See above findings.      PT Education - 01/31/18 1057    Education Details  POC, prognosis, HEP    Person(s) Educated  Patient    Methods  Explanation;Demonstration;Handout    Comprehension  Verbalized understanding;Need further instruction       PT Short Term Goals - 01/31/18 1107      PT SHORT TERM GOAL #1   Title  Pt will be independent with HEP in order to improve lumbar ROM and decrease back pain in order to improve pain-free function at home and work.     Baseline  IE: provided    Time  4    Period  Weeks    Status  New    Target Date  02/27/18        PT Long Term Goals - 01/31/18 1108      PT LONG TERM GOAL #1   Title  Patient will demonstrate a clinically significant improvement in function as evidenced by a score on the POPIQ-7 of less than or equal to 59.6% for improved QOL and decreased pelvic symptoms.    Baseline  IE: 85.7%    Time  12    Period  Weeks    Status  New    Target Date  04/25/18      PT LONG TERM GOAL #2   Title  Patient will demonstrate improved PFM function and management of intra-abdominal pressure as evidenced by the ability to cough/sneeze/laugh without leakage 90% of the time for improved QOL and ability to participate fully in activities.    Baseline  IE: sneeze 0%, cough 10%, laugh 80%.     Time  12  Period  Weeks    Status  New    Target Date  04/25/18      PT LONG TERM GOAL #3   Title  Patient will demonstrate low to no pain improved lumbar lateral flexion to the R as evidenced by a measurement of third digit to floor symmetrical with L side and report  of <4/10 L sided back pain for improved function at work and in the [employer].     Baseline  IE: R lumbar lateral flexion 61 cm from floor pain 7/10, L lumbar lateral flexion 55cm from floor.    Time  12    Period  Weeks    Status  New    Target Date  04/25/18      PT LONG TERM GOAL #4   Title  Patient will demonstrate proper body mechanics for lifting, bending, squatting to allow for decreased fear of movement as measured by FAB-Q in order to return to PLOF and improved overall QOL.    Baseline  IE: FABQ not administered at this date    Time  4    Period  Weeks    Status  New    Target Date  04/25/18             Plan - 01/31/18 1059    Clinical Impression Statement  Patient is a pleasant 58 year old female presenting to clinic with chief complaints of chronic back pain with radiating symptoms and stress urinary incontinence symptoms. PT examination revealed deficits in pain (worst 9/10), strength (LLE grossly 3+/5), spine ROM (L rotation 50% of R, R lumbar lateral flexion painful and limited, lumbar flexion painful and limited), flexibility (B hamstrings, B hip flexors, B adductors; L deficits and pain>R), postural deficits, lack of coordination of PFM and mismanagement of intra-abdominal pressures under physical stress, all of which are negatively impacting her ability to function and participate in work and life demands. Patient's progress may be limited due to the chronicity of the symptoms, fear avoidance behaviors, and repetitive loading demands from her vocation; however, the patient demonstrates excellent motivation. Patient will benefit from skilled therapeutic intervention to address the aforementioned deficits in order to decrease symptoms and improve overall QOL.    History and Personal Factors relevant to plan of care:  (+) motivation, social support, level of independence, (-) chronicity, regional interdependent dysfunction, fear of movement, recurrent sinus infections,  radiating s/s, hx of abdominal surgeries, psychological distress    Clinical Presentation  Evolving    Clinical Presentation due to:  Moderate (evolving): 1-2 personal factors/comorbidities, 3 or more body systems/activity limitations/participation restrictions      Clinical Decision Making  Moderate    Rehab Potential  Fair    PT Frequency  1x / week    PT Duration  12 weeks    PT Treatment/Interventions  Aquatic Therapy;ADLs/Self Care Home Management;Cryotherapy;Electrical Stimulation;Moist Heat;Biofeedback;Gait training;Stair training;Functional mobility training;Therapeutic activities;Therapeutic exercise;Balance training;Neuromuscular re-education;Patient/family education;Manual techniques;Dry needling;Taping;Joint Manipulations;Spinal Manipulations;Scar mobilization    PT Next Visit Plan  FAB-Q, gentle manual interventions for back and pelvic alignment as tolerated, diaphragmatic breathing coordination    PT Home Exercise Plan  Access Code: [email or web address]    Consulted and Agree with Plan of Care  Patient       Patient will benefit from skilled therapeutic intervention in order to improve the following deficits and impairments:  Pain, Improper body mechanics, Decreased coordination, Increased muscle spasms, Postural dysfunction, Decreased strength, Decreased endurance, Decreased activity tolerance, Decreased range of motion, Impaired flexibility,  Decreased balance, Decreased mobility, Hypomobility  Visit Diagnosis: Mixed stress and urge urinary incontinence  Muscle weakness (generalized)  Other lack of coordination  Chronic left-sided low back pain, unspecified whether sciatica present  Left lower quadrant abdominal pain  Abnormal posture     Problem List Patient Active Problem List   Diagnosis Date Noted  . Bipolar 1 disorder (HCC) 10/31/2016  . Chronic low back pain with sciatica 10/31/2016  . Allergic cough 10/31/2016  . Hx of colonic polyps 10/31/2016   Sheria Lang PT,  DPT [phone] 01/31/2018, 12:40 PM  [facility] MAIN [facility] SERVICES [address] [place], [place], [address] Phone: [phone]   Fax:  [phone]  Name: Javeah Vancourt MRN: [id number] Date of Birth: 1960/09/25

## 2018-01-31 NOTE — Patient Instructions (Signed)
Access Code: [email or web address]  URL: [email or web address]  Date: 01/31/2018  Prepared by: Sheria Lang   Exercises Standing Sidebend Stretch with Single Arm Overhead - 3 sets - 30  hold - 1x daily - 7x weekly Supine Diaphragmatic Breathing - 10 reps - 2x daily - 7x weekly

## 2018-02-05 ENCOUNTER — Other Ambulatory Visit: Payer: Self-pay

## 2018-02-05 ENCOUNTER — Ambulatory Visit
Admission: EM | Admit: 2018-02-05 | Discharge: 2018-02-05 | Disposition: A | Discharge status: Home / Self Care | Payer: 59 | Attending: Emergency Medicine | Admitting: Emergency Medicine

## 2018-02-05 DIAGNOSIS — H66002 Acute suppurative otitis media without spontaneous rupture of ear drum, left ear: Secondary | ICD-10-CM | POA: Diagnosis not present

## 2018-02-05 LAB — RAPID STREP SCREEN (MED CTR MEBANE ONLY): Streptococcus, Group A Screen (Direct): NEGATIVE

## 2018-02-05 MED ORDER — CEFDINIR 300 MG PO CAPS: 300.0000 mg | ORAL_CAPSULE | Freq: Two times a day (BID) | ORAL | 0 refills | Status: AC

## 2018-02-05 MED ORDER — FLUTICASONE PROPIONATE 50 MCG/ACT NA SUSP: 2.0000 | Freq: Every day | NASAL | 0 refills | Status: AC

## 2018-02-05 MED ORDER — IBUPROFEN 600 MG PO TABS: 600.0000 mg | ORAL_TABLET | Freq: Four times a day (QID) | ORAL | 0 refills | Status: AC | PRN

## 2018-02-05 NOTE — ED Triage Notes (Signed)
Patient complains of headache, ear pain and facial pain that started around Christmas. States that she was seen by Dr. Asencion Partridge and prescribed azithromycin and that did not help. Patient states that then she was started on Augmentin which helped at first and stopped helping. Patient states that she finished the Augmentin on Saturday and symptoms returned.

## 2018-02-05 NOTE — Discharge Instructions (Addendum)
Omnicef x 7 days the ear infection, Flonase, ibuprofen 600 mg combined with 1 g of Tylenol 3 or 4 times a day as needed for pain.  The ibuprofen and Flonase will help with the inflammation in your sinuses.

## 2018-02-05 NOTE — ED Provider Notes (Signed)
HPI  SUBJECTIVE:  Jenna Estrada is a 58 y.o. female who presents with 7 8 days of bilateral ear pain worse in the left more so than the right, she describes the pain is constant throbbing achy.  She also reports a left-sided sore throat.  No change in hearing, otorrhea.  No fevers.  No nasal congestion, rhinorrhea, ear popping.  The pain is not associate with chewing, yawning.  She denies dental pain.  She also reports mild maxillary pain with breathing cold air.  No recent swimming, trauma to the ear, foreign body insertion.  She does report a frontal headache today. Patient was seen by her PMD for cough, thought to have bronchitis on 12/13, then 12/30 with maxillary sinusitis.  Sent home with a Z-Pak.  Prescribed 10 days of Augmentin on 1/8, which patient states helped.  She states that sinusitis feels that it has mostly resolved but has not completely cleared.  Patient has tried Z-Pak, Augmentin, vitamin C, ibuprofen 800 mg every 6 hours, elderberry and Echinacea.  States that the Augmentin helped for about a week but her symptoms returned.  Symptoms worse with swallowing, talking.  She grinds her teeth at night.  Past medical history of chickenpox, bipolar 1.  No history of TMJ, frequent otitis media, shingles.  GEZ:[id number]:Berglund, Nyoka CowdenLaura H, MD   Past Medical History:  Diagnosis Date  . Anxiety   . Bipolar 1 disorder (HCC)   . Sciatic nerve pain, left    taking gabapentin.    Past Surgical History:  Procedure Laterality Date  . ABDOMINAL HYSTERECTOMY  2008   fibroids, ovaries remain  . APPENDECTOMY     1971  . COLONOSCOPY  10/2014 and 06/17/2015   Every 3 yr due to incomplete prep  . HERNIA REPAIR    . 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 TONSILLECTOMY    . WRIST FRACTURE SURGERY     plate put in right wrist     Family History  Problem Relation Age of Onset  . Hypertension Mother   . Rectal cancer Father   . COPD Father   . Asthma Father   . Breast cancer Neg Hx      Social History   Tobacco Use  . Smoking status: Never Smoker  . Smokeless tobacco: Never Used  Substance Use Topics  . Alcohol use: No  . Drug use: No    No current facility-administered medications for this encounter.   Current Outpatient Medications:  .  ARIPiprazole (ABILIFY) 5 MG tablet, 2.5 mg. , Disp: , Rfl:  .  buPROPion (WELLBUTRIN XL) 300 MG 24 hr tablet, Take 200 mg by mouth daily. , Disp: , Rfl:  .  calcium-vitamin D (OSCAL WITH D) 500-200 MG-UNIT tablet, Take 2 tablets by mouth daily with breakfast., Disp: , Rfl:  .  DULoxetine (CYMBALTA) 60 MG capsule, Take 60 mg by mouth daily., Disp: , Rfl:  .  gabapentin (NEURONTIN) 300 MG capsule, TAKE 1 CAPSULE TWICE DAILY (Patient taking differently: 600 mg 2 (two) times daily. ), Disp: 180 capsule, Rfl: 1 .  hydrOXYzine (VISTARIL) 25 MG capsule, Take 25 mg by mouth 3 (three) times daily as needed., Disp: , Rfl:  .  L-methylfolate Calcium 15 MG TABS, TK 1 T PO D, Disp: , Rfl: 0 .  LamoTRIgine 100 MG TBDP, Take 200 mg by mouth daily., Disp: , Rfl:  .  Multiple Vitamin (MULTIVITAMIN WITH MINERALS) TABS tablet, Take 1 tablet by mouth daily., Disp: ,  Rfl:  .  tiZANidine (ZANAFLEX) 4 MG tablet, Take 1 tablet (4 mg total) by mouth every 8 (eight) hours as needed for muscle spasms., Disp: 30 tablet, Rfl: 2 .  cefdinir (OMNICEF) 300 MG capsule, Take 1 capsule (300 mg total) by mouth 2 (two) times daily for 7 days., Disp: 14 capsule, Rfl: 0 .  fluticasone (FLONASE) 50 MCG/ACT nasal spray, Place 2 sprays into both nostrils daily., Disp: 16 g, Rfl: 0 .  ibuprofen (ADVIL,MOTRIN) 600 MG tablet, Take 1 tablet (600 mg total) by mouth every 6 (six) hours as needed., Disp: 30 tablet, Rfl: 0  Allergies  Allergen Reactions  . Adhesive [Tape] Rash     ROS  As noted in HPI.   Physical Exam  BP 137/73 (BP Location: Right Arm)   Pulse 80   Temp 98.3 F (36.8 C) (Oral)   Resp 16   Ht 5\' 6"  (1.676 m)   Wt 72.6 kg   SpO2 96%   BMI 25.82  kg/m   Constitutional: Well developed, well nourished, no acute distress Eyes:  EOMI, conjunctiva normal bilaterally HENT: Normocephalic, atraumatic,mucus membranes moist.  Positive mild maxillary, frontal sinus tenderness.  Minimal nasal congestion.  Normal oropharynx.  Left external ear normal.  EAC normal.  No pain with traction on pinna, palpation of tragus or mastoid.  Left TM dull, bulging with purulent drainage behind TM.  No erythema.  Right TM normal.  No TMJ tenderness. Neck: No cervical lymphadenopathy. Respiratory: Normal inspiratory effort Cardiovascular: Normal rate GI: nondistended skin: No rash, skin intact Musculoskeletal: no deformities Neurologic: Alert & oriented x 3, no focal neuro deficits Psychiatric: Speech and behavior appropriate   ED Course   Medications - No data to display  Orders Placed This Encounter  Procedures  . Rapid Strep Screen ([facility] ONLY)    Standing Status:   Standing    Number of Occurrences:   1  . Culture, group A strep    Standing Status:   Standing    Number of Occurrences:   1    Results for orders placed or performed during the hospital encounter of 02/05/18 (from the past 24 hour(s))  Rapid Strep Screen ([facility] ONLY)     Status: None   Collection Time: 02/05/18  3:52 PM  Result Value Ref Range   Streptococcus, Group A Screen (Direct) NEGATIVE NEGATIVE   No results found.  ED Clinical Impression  Non-recurrent acute suppurative otitis media of left ear without spontaneous rupture of tympanic membrane   ED Assessment/Plan  Outside records reviewed.  As noted in HPI.  Patient with an otitis media which I suspect resulted from a sinus infection.  sending home with Omnicef x 7 days, Flonase, ibuprofen 600 mg combined with 1 g of Tylenol 3 or 4 times a day as needed for pain.  Suspect residual inflammation from the sinusitis, she was treated appropriately with 10 days of Augmentin, so withholding  fluoroquinolones at this point in time.  Patient is to follow-up with her primary care physician in 3 days if she is not getting any better.  Discussed MDM, treatment plan, and plan for follow-up with patient. Discussed sn/sx that should prompt return to the ED. patient agrees with plan.   Meds ordered this encounter  Medications  . fluticasone (FLONASE) 50 MCG/ACT nasal spray    Sig: Place 2 sprays into both nostrils daily.    Dispense:  16 g    Refill:  0  . cefdinir (  OMNICEF) 300 MG capsule    Sig: Take 1 capsule (300 mg total) by mouth 2 (two) times daily for 7 days.    Dispense:  14 capsule    Refill:  0  . ibuprofen (ADVIL,MOTRIN) 600 MG tablet    Sig: Take 1 tablet (600 mg total) by mouth every 6 (six) hours as needed.    Dispense:  30 tablet    Refill:  0    *This clinic note was created using Scientist, clinical (histocompatibility and immunogenetics). Therefore, there may be occasional mistakes despite careful proofreading.   ?   Domenick Gong, MD 02/06/18 [phone]

## 2018-02-08 ENCOUNTER — Other Ambulatory Visit: Payer: Self-pay | Admitting: Internal Medicine

## 2018-02-08 DIAGNOSIS — M545 Low back pain, unspecified: Secondary | ICD-10-CM

## 2018-02-08 LAB — CULTURE, GROUP A STREP (THRC)

## 2018-02-10 ENCOUNTER — Ambulatory Visit: Payer: No Typology Code available for payment source

## 2018-02-10 DIAGNOSIS — R1032 Left lower quadrant pain: Secondary | ICD-10-CM

## 2018-02-10 DIAGNOSIS — N3946 Mixed incontinence: Secondary | ICD-10-CM

## 2018-02-10 DIAGNOSIS — M6281 Muscle weakness (generalized): Secondary | ICD-10-CM

## 2018-02-10 DIAGNOSIS — M545 Low back pain, unspecified: Secondary | ICD-10-CM

## 2018-02-10 DIAGNOSIS — G8929 Other chronic pain: Secondary | ICD-10-CM

## 2018-02-10 DIAGNOSIS — R293 Abnormal posture: Secondary | ICD-10-CM

## 2018-02-10 DIAGNOSIS — R278 Other lack of coordination: Secondary | ICD-10-CM

## 2018-02-10 NOTE — Therapy (Signed)
[place] [facility]IN [facility] SERVICES [address] [place], [place], [address] Phone: [phone]   Fax:  [phone]  Physical Therapy Treatment  Patient Details  Name: Jenna Estrada MRN: [id number] Date of Birth: Jan 08, 1961 Referring Provider (PT): Halina Maidens, MD   Encounter Date: 02/10/2018  PT End of Session - 02/10/18 0859    Visit Number  2    Number of Visits  13    Date for PT Re-Evaluation  04/24/18    Authorization Type  [facility]    Authorization - Visit Number  1    Authorization - Number of Visits  13    PT Start Time  3614    PT Stop Time  0838    PT Time Calculation (min)  900 min    Activity Tolerance  Patient tolerated treatment well    Behavior During Therapy  [facility] for tasks assessed/performed       Past Medical History:  Diagnosis Date  . Anxiety   . Bipolar 1 disorder ([place])   . Sciatic nerve pain, left    taking gabapentin.    Past Surgical History:  Procedure Laterality Date  . ABDOMINAL HYSTERECTOMY  2008   fibroids, ovaries remain  . APPENDECTOMY     1971  . COLONOSCOPY  10/2014 and 06/17/2015   Every 3 yr due to incomplete prep  . HERNIA REPAIR    . 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 TONSILLECTOMY    . WRIST FRACTURE SURGERY     plate put in right wrist     There were no vitals filed for this visit.    Pelvic Floor Physical Therapy Treatment Note  SCREENING  Changes in medications, allergies, or medical history?: none   SUBJECTIVE  Patient reports: She was not able to access her HEP online but has been doing the overhead stretch, forgot about the breathing.  Pain update:  Location of pain: L LB and LE Current pain:  6/10  Max pain:  8/10 Least pain:  3/10 Nature of pain: throbbing  *following treatment Pt. Described less pain in her back and a tingling sensation in thigh but feeling of less tension.  Patient Goals: Decrease pain and urinary incontinence.     OBJECTIVE  Changes in:  Range of Motion/Flexibilty:  Pt. Demonstrates ~ -10 deg of passive L hip ext. In supine prior to treatment and ~ -3 degrees following treatment.  Abdominal:  Pt. Able to use diaphragmatic breathing effectively with attention.  Palpation: TTP to L iliacus with light pressure at beginning of session, pain does not occur until moderate/heavy pressure applied following treatment.  INTERVENTIONS THIS SESSION: Manual: Performed STM and TP release to L iliacus to decrease pain and spasm and allow for decreased pressure on femoral nerve and improved pelvic alignment. Dry-needle: Performed TPDN with a .30x31m needle to L iliacus to decrease pain and spasm and allow for decreased pressure on femoral nerve and improved pelvic alignment. Therex: Reviewed diaphragmatic breathing and side-stretch and educated on and practiced hip-flexor stretch and MET correction with bolster to improve balance of musculature surrounding the pelvis and improve pelvic alignment to decrease spasm and pain.  Self-care: educated on how diaphragmatic breathing encourages improved coordination and ROM of the PFM and allows for decreased neural sensitivity from decreased feedback in the "fight-or-flight mechanism.  Total time: 60 min.                   Trigger  Point Dry Needling - 02/10/18 0843    Consent Given?  Yes    Education Handout Provided  No    Muscles Treated Upper Body  Iliopsoas   Left          PT Education - 02/10/18 0859    Education Details  See Pt. Instructions and Interventions this session.    Person(s) Educated  Patient    Methods  Explanation;Tactile cues;Verbal cues;Handout    Comprehension  Verbalized understanding;Returned demonstration;Verbal cues required;Need further instruction;Tactile cues required       PT Short Term Goals - 01/31/18 1107      PT SHORT TERM GOAL #1   Title  Pt will be independent with HEP in order to improve lumbar  ROM and decrease back pain in order to improve pain-free function at home and work.     Baseline  IE: provided    Time  4    Period  Weeks    Status  New    Target Date  02/27/18        PT Long Term Goals - 01/31/18 1108      PT LONG TERM GOAL #1   Title  Patient will demonstrate a clinically significant improvement in function as evidenced by a score on the POPIQ-7 of less than or equal to 59.6% for improved QOL and decreased pelvic symptoms.    Baseline  IE: 85.7%    Time  12    Period  Weeks    Status  New    Target Date  04/25/18      PT LONG TERM GOAL #2   Title  Patient will demonstrate improved PFM function and management of intra-abdominal pressure as evidenced by the ability to cough/sneeze/laugh without leakage 90% of the time for improved QOL and ability to participate fully in activities.    Baseline  IE: sneeze 0%, cough 10%, laugh 80%.     Time  12    Period  Weeks    Status  New    Target Date  04/25/18      PT LONG TERM GOAL #3   Title  Patient will demonstrate low to no pain improved lumbar lateral flexion to the R as evidenced by a measurement of third digit to floor symmetrical with L side and report of <4/10 L sided back pain for improved function at work and in the [employer].     Baseline  IE: R lumbar lateral flexion 61 cm from floor pain 7/10, L lumbar lateral flexion 55cm from floor.    Time  12    Period  Weeks    Status  New    Target Date  04/25/18      PT LONG TERM GOAL #4   Title  Patient will demonstrate proper body mechanics for lifting, bending, squatting to allow for decreased fear of movement as measured by FAB-Q in order to return to PLOF and improved overall QOL.    Baseline  IE: FABQ not administered at this date    Time  46    Period  Weeks    Status  New    Target Date  04/25/18            Plan - 02/10/18 0900    Clinical Impression Statement  Pt. responded well to all treatments today, with slow but positive responde to TP  release promptine addition of dry-needling to L hip-flexors as well as positive history of success with this treatment in the  past. She demonstrated decreased spasm and increased ROM as well as understanding of all education and exercises provided this session. Continue per POC.    Clinical Presentation  Evolving    Clinical Decision Making  Moderate    Rehab Potential  Fair    PT Frequency  1x / week    PT Duration  12 weeks    PT Treatment/Interventions  Aquatic Therapy;ADLs/Self Care Home Management;Cryotherapy;Electrical Stimulation;Moist Heat;Biofeedback;Gait training;Stair training;Functional mobility training;Therapeutic activities;Therapeutic exercise;Balance training;Neuromuscular re-education;Patient/family education;Manual techniques;Dry needling;Taping;Joint Manipulations;Spinal Manipulations;Scar mobilization    PT Next Visit Plan  FAB-Q, gentle manual interventions for back and pelvic alignment as tolerated, diaphragmatic breathing coordination    PT Home Exercise Plan  Access Code: [email or web address], [place] and Agree with Plan of Care  Patient       Patient will benefit from skilled therapeutic intervention in order to improve the following deficits and impairments:  Pain, Improper body mechanics, Decreased coordination, Increased muscle spasms, Postural dysfunction, Decreased strength, Decreased endurance, Decreased activity tolerance, Decreased range of motion, Impaired flexibility, Decreased balance, Decreased mobility, Hypomobility  Visit Diagnosis: Mixed stress and urge urinary incontinence  Muscle weakness (generalized)  Other lack of coordination  Chronic left-sided low back pain, unspecified whether sciatica present  Left lower quadrant abdominal pain  Abnormal posture     Problem List Patient Active Problem List   Diagnosis Date Noted  . Bipolar 1 disorder ([facility]) 10/31/2016  . Chronic low back pain with sciatica 10/31/2016  . Allergic cough 10/31/2016   . Hx of colonic polyps 10/31/2016   Willa Rough DPT, ATC Willa Rough 02/10/2018, 9:06 AM  [place] MAIN [facility] SERVICES [address] [place], [place], [address] Phone: [phone]   Fax:  [phone]  Name: Jenna Estrada MRN: [id number] Date of Birth: [birth year]

## 2018-02-10 NOTE — Patient Instructions (Addendum)
Pelvic Rotation: Contract / Relax (Supine)    With knees bent over bolster, left knee crossed over other, press thighs together tightly without allowing movement. Hold __5__ seconds. Relax. Repeat __5__ times per set. Do __1-2__ sets per session. Do __1__ sessions per day.   Access Code: [email or web address]  URL: [email or web address]  Date: 02/10/2018  Prepared by: Flora Lipps   Program Notes     Exercises  Supine Diaphragmatic Breathing with Pelvic Floor Lengthening - 10 reps - 3 sets - 1x daily - 7x weekly  TL Sidebending Stretch - Single Arm Overhead - 3 sets - 30 sec hold - 1x daily - 7x weekly  Hip Flexor Stretch at Edge of Bed - 3 sets - 30 sec. hold - 1x daily - 7x weekly

## 2018-02-19 ENCOUNTER — Encounter: Payer: Non-veteran care | Admitting: Physical Therapy

## 2018-02-25 ENCOUNTER — Ambulatory Visit: Payer: No Typology Code available for payment source | Attending: Family Medicine | Admitting: Physical Therapy

## 2018-02-25 ENCOUNTER — Encounter: Payer: Self-pay | Admitting: Physical Therapy

## 2018-02-25 DIAGNOSIS — M545 Low back pain: Secondary | ICD-10-CM | POA: Insufficient documentation

## 2018-02-25 DIAGNOSIS — N3946 Mixed incontinence: Secondary | ICD-10-CM | POA: Insufficient documentation

## 2018-02-25 DIAGNOSIS — R293 Abnormal posture: Secondary | ICD-10-CM | POA: Insufficient documentation

## 2018-02-25 DIAGNOSIS — R1032 Left lower quadrant pain: Secondary | ICD-10-CM | POA: Insufficient documentation

## 2018-02-25 DIAGNOSIS — M6281 Muscle weakness (generalized): Secondary | ICD-10-CM | POA: Diagnosis present

## 2018-02-25 DIAGNOSIS — G8929 Other chronic pain: Secondary | ICD-10-CM | POA: Diagnosis present

## 2018-02-25 DIAGNOSIS — R278 Other lack of coordination: Secondary | ICD-10-CM | POA: Insufficient documentation

## 2018-02-25 NOTE — Therapy (Signed)
[place] MAIN [facility] SERVICES [address] [place], [place], [address] Phone: [phone]   Fax:  [phone]  Physical Therapy Treatment  Patient Details  Name: Jenna Estrada MRN: [id number] Date of Birth: 1960-04-19 Referring Provider (PT): Halina Maidens, MD   Encounter Date: 02/25/2018  PT End of Session - 02/25/18 1038    Visit Number  3    Number of Visits  13    Date for PT Re-Evaluation  04/24/18    Authorization Type  [facility]    Authorization - Visit Number  2    Authorization - Number of Visits  13    PT Start Time  2440    PT Stop Time  1140    PT Time Calculation (min)  60 min    Activity Tolerance  Patient tolerated treatment well    Behavior During Therapy  [facility] for tasks assessed/performed       Past Medical History:  Diagnosis Date  . Anxiety   . Bipolar 1 disorder ([place])   . Sciatic nerve pain, left    taking gabapentin.    Past Surgical History:  Procedure Laterality Date  . ABDOMINAL HYSTERECTOMY  2008   fibroids, ovaries remain  . APPENDECTOMY     1971  . COLONOSCOPY  10/2014 and 06/17/2015   Every 3 yr due to incomplete prep  . HERNIA REPAIR    . 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 TONSILLECTOMY    . WRIST FRACTURE SURGERY     plate put in right wrist     There were no vitals filed for this visit.  Subjective Assessment - 02/25/18 1037    Subjective  Patient states that she feels things are a little better. She continues to need medication for sleeping because of leg pain. Patient states that her breathing exercises help to calm pain. The back still flares up but not consistently. She has been doing HEP but reports not daily. 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    Pertinent History  G3, P1 (vaginal delivery, no report of tearing); abdominal hysterectomy, persistent L sided abdominal pain, fear of movement    Limitations  Lifting;Standing;Walking;Sitting;House hold activities    Patient Stated Goals  Feel better, learn techniques, strengthen the back, reduce night pain.    Currently in Pain?  Yes    Pain Score  5     Pain Location  Leg    Pain Orientation  Left    Pain Descriptors / Indicators  Aching    Pain Type  Chronic pain    Pain Onset  More than a month ago    Multiple Pain Sites  No    Pain Onset  More than a month ago      Patient reports a global rate of change of 40% since start of physical therapy treatment (01/30/2018).  TREATMENT  Manual Therapy: Lumbar paraspinals STM/TPR; patient had greater pain with palpation on R and increased pain response in LLE with R STM and TPR. Grade I/II  Mobilizations:  CPAs L1-L5; L3-4 painful, no change with repetition. L1 relieved pain in lower spine.   R UPAs L1-L5; L2-L4 concordant LLE pain, no relief with repetition. L1 RUPA relieved LLE  tingling and pain.  L UPAs  L1-L5; L2, L4 painful, no relief with repetition.   Neuromuscular Re-education: Supine diaphragmatic breathing, VCs to prevent abdominal distension during inhalation Supine gentle sciatic nerve flossing on LLE, patient encouraged to work pain free and limit repetitions for decreased irritation of nerve Supine hooklying trunk rotation, VCs to limit ROM to pain free and relaxing as opposed to stretching Supine hooklying, posterior pelvic tilts, TCs and VCs for controlled ROM without gluteal compensations Supine hooklying, TrA activation with exhalations, TCs and VCs to prevent spinal movement with contraction Prone multifidi engagement, VCs for sequencing. Patient has decreased active control of posture with LLE lift and delayed and limited activation of R lumbar multifidi with LLE lift.  Seated TrA activation  with VCs for set-up Standing Multifidi motor planning activity with VCs and TCs   Patient educated throughout session on appropriate technique and form using multi-modal cueing, HEP and activity modification. Patient articulated understanding and returned demonstration.   Patient Response to interventions: Patient reported high confidence in her ability to perform exercises at home and continue to develop body awareness. Patient reported decreased tension in lumbar paraspinals s/p manual intervention.   ASSESSMENT Patient presents to clinic with excellent motivation to participate in therapy. Patient demonstrates deficits in postural muscle coordination, abdominal strength, lumbar paraspinal muscle relaxation, and pain as evidenced by increased compensatory patterns during neuromuscular re-education activities. Patient able to achieve coordinated TrA activation with diaphragmatic breathing pattern during today's session and responded positively to gentle manual interventions, as well as education on postural muscle coordination. Patient will benefit from continued skilled therapeutic intervention to address remaining deficits in strength, activity tolerance, pain, spinal ROM, and coordination  in order to reduce pain and episodes of incontinence, increase function, and improve overall QOL.        PT Short Term Goals - 01/31/18 1107      PT SHORT TERM GOAL #1   Title  Pt will be independent with HEP in order to improve lumbar ROM and decrease back pain in order to improve pain-free function at home and work.     Baseline  IE: provided    Time  4    Period  Weeks    Status  New    Target Date  02/27/18        PT Long Term Goals - 01/31/18 1108      PT LONG TERM GOAL #1   Title  Patient will demonstrate a clinically significant improvement in function as evidenced by a score on the POPIQ-7 of less than or equal to 59.6% for improved QOL and decreased pelvic symptoms.    Baseline   IE: 85.7%    Time  12    Period  Weeks    Status  New    Target Date  04/25/18      PT LONG TERM GOAL #2   Title  Patient will demonstrate improved PFM function and management of intra-abdominal pressure as evidenced by the ability to cough/sneeze/laugh without leakage 90% of the time for improved QOL and ability to participate fully in activities.    Baseline  IE: sneeze 0%, cough 10%, laugh 80%.     Time  12    Period  Weeks    Status  New    Target Date  04/25/18      PT LONG TERM GOAL #3   Title  Patient will demonstrate low to no pain improved lumbar lateral flexion to the R as evidenced  by a measurement of third digit to floor symmetrical with L side and report of <4/10 L sided back pain for improved function at work and in the [employer].     Baseline  IE: R lumbar lateral flexion 61 cm from floor pain 7/10, L lumbar lateral flexion 55cm from floor.    Time  12    Period  Weeks    Status  New    Target Date  04/25/18      PT LONG TERM GOAL #4   Title  Patient will demonstrate proper body mechanics for lifting, bending, squatting to allow for decreased fear of movement as measured by FAB-Q in order to return to PLOF and improved overall QOL.    Baseline  IE: FABQ not administered at this date    Time  38    Period  Weeks    Status  New    Target Date  04/25/18            Plan - 02/25/18 1038    Clinical Impression Statement  Patient presents to clinic with excellent motivation to participate in therapy. Patient demonstrates deficits in postural muscle coordination, abdominal strength, lumbar paraspinal muscle relaxation, and pain as evidenced by increased compensatory patterns during neuromuscular re-education activities. Patient able to achieve coordinated TrA activation with diaphragmatic breathing pattern during today's session and responded positively to gentle manual interventions, as well as education on postural muscle coordination. Patient will benefit from continued  skilled therapeutic intervention to address remaining deficits in strength, activity tolerance, pain, spinal ROM, and coordination  in order to reduce pain and episodes of incontinence, increase function, and improve overall QOL.    Rehab Potential  Fair    PT Frequency  1x / week    PT Duration  12 weeks    PT Treatment/Interventions  Aquatic Therapy;ADLs/Self Care Home Management;Cryotherapy;Electrical Stimulation;Moist Heat;Biofeedback;Gait training;Stair training;Functional mobility training;Therapeutic activities;Therapeutic exercise;Balance training;Neuromuscular re-education;Patient/family education;Manual techniques;Dry needling;Taping;Joint Manipulations;Spinal Manipulations;Scar mobilization    PT Next Visit Plan  FAB-Q, hip flexor MET, lumbar gapping manual, progress deep core strengthening    PT Home Exercise Plan  Access Code: [email or web address]    Consulted and Agree with Plan of Care  Patient       Patient will benefit from skilled therapeutic intervention in order to improve the following deficits and impairments:  Pain, Improper body mechanics, Decreased coordination, Increased muscle spasms, Postural dysfunction, Decreased strength, Decreased endurance, Decreased activity tolerance, Decreased range of motion, Impaired flexibility, Decreased balance, Decreased mobility, Hypomobility  Visit Diagnosis: Mixed stress and urge urinary incontinence  Muscle weakness (generalized)  Other lack of coordination  Chronic left-sided low back pain, unspecified whether sciatica present  Left lower quadrant abdominal pain  Abnormal posture     Problem List Patient Active Problem List   Diagnosis Date Noted  . Bipolar 1 disorder ([place]) 10/31/2016  . Chronic low back pain with sciatica 10/31/2016  . Allergic cough 10/31/2016  . Hx of colonic polyps 10/31/2016    Myles Gip PT, DPT [id number] 02/25/2018, 3:21 PM  [place] MAIN [facility]  SERVICES [address] [place], [place], [address] Phone: [phone]   Fax:  [phone]  Name: Lurene Robley MRN: [id number] Date of Birth: Nov 07, 1960

## 2018-03-04 ENCOUNTER — Encounter: Payer: Self-pay | Admitting: Physical Therapy

## 2018-03-04 ENCOUNTER — Ambulatory Visit: Payer: No Typology Code available for payment source | Admitting: Physical Therapy

## 2018-03-04 DIAGNOSIS — N3946 Mixed incontinence: Secondary | ICD-10-CM | POA: Diagnosis not present

## 2018-03-04 DIAGNOSIS — R293 Abnormal posture: Secondary | ICD-10-CM

## 2018-03-04 DIAGNOSIS — M545 Low back pain, unspecified: Secondary | ICD-10-CM

## 2018-03-04 DIAGNOSIS — R1032 Left lower quadrant pain: Secondary | ICD-10-CM

## 2018-03-04 DIAGNOSIS — M6281 Muscle weakness (generalized): Secondary | ICD-10-CM

## 2018-03-04 DIAGNOSIS — R278 Other lack of coordination: Secondary | ICD-10-CM

## 2018-03-04 DIAGNOSIS — G8929 Other chronic pain: Secondary | ICD-10-CM

## 2018-03-04 NOTE — Therapy (Signed)
[place] [facility] MAIN [facility] SERVICES [address] [place], [place], [address] Phone: [phone]   Fax:  [phone]  Physical Therapy Treatment  Patient Details  Name: Jenna Estrada MRN: [id number] Date of Birth: 1960/09/19 Referring Provider (PT): Bari Edward, MD   Encounter Date: 03/04/2018  PT End of Session - 03/04/18 0818    Visit Number  4    Number of Visits  13    Date for PT Re-Evaluation  04/24/18    Authorization Type  [facility]    Authorization - Visit Number  3    Authorization - Number of Visits  13    PT Start Time  0820    PT Stop Time  0920    PT Time Calculation (min)  60 min    Activity Tolerance  Patient tolerated treatment well    Behavior During Therapy  [facility] for tasks assessed/performed       Past Medical History:  Diagnosis Date  . Anxiety   . Bipolar 1 disorder ([facility])   . Sciatic nerve pain, left    taking gabapentin.    Past Surgical History:  Procedure Laterality Date  . ABDOMINAL HYSTERECTOMY  2008   fibroids, ovaries remain  . APPENDECTOMY     1971  . COLONOSCOPY  10/2014 and 06/17/2015   Every 3 yr due to incomplete prep  . HERNIA REPAIR    . 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 TONSILLECTOMY    . WRIST FRACTURE SURGERY     plate put in right wrist     There were no vitals filed for this visit.  Subjective Assessment - 03/04/18 4709    Subjective  Patient reports that she has been pretty good at doing the exercises but notes some questions. On the whole she feels independent with the exercises. Patient went hiking at [facility]. and back is a little sore from that (hike was 1.6 mi with incline).     Pertinent History  G3, P1 (vaginal delivery, no report of tearing); abdominal hysterectomy, persistent L sided abdominal pain, fear of movement    Limitations  Lifting;Standing;Walking;Sitting;House hold activities    Patient Stated Goals  Feel better, learn techniques, strengthen the back, reduce  night pain.    Currently in Pain?  Yes    Pain Score  7     Pain Location  Back    Pain Orientation  Left    Pain Descriptors / Indicators  Aching    Pain Type  Chronic pain    Pain Onset  More than a month ago    Pain Onset  More than a month ago        TREATMENT Pre-tx assessment: R ASIS anterior shift and inferior. R lumbar paraspinals noted increased tone and TTP. Lumbar flexion to 44 degrees, L lateral lumbar flexion 54.5 cm from floor, R lateral lumbar flexion 60.5 cm from floor and painful.  Manual Therapy: STM/TPR to R lumbar paraspinals, R QL, R glute medius and glute min. Patient had significant pain initially; however as treatment progressed patient reported decreased tightness.  MFR to ligaments along the sacral border R innominate posterior mobilizations with gentle sacral gapping  R sacral lateral border mobilization and release with RLE ER/aBduction   Neuromuscular Re-education: Diaphragmatic breathing Diaphragmatic breathing with pelvic tilts A/P for decreased muscle tension and improved spinal mobility for standing activities.  Pelvic tilts M/L for decreased muscle tension and improved spinal mobility for standing  activities.  Post-tx assessment: R ASIS minimal anterior shift. R lumbar paraspinals symmetrical in tone with L. Lumbar motions not increasing pain.  Treatments unbilled: MHP to lumbo-sacral area during supine neuromuscular re-education activities.   Patient Response to interventions: Patient reported feeling "more limber" after interventions; notes a 4/10 pain level.  Patient educated throughout session on appropriate technique and form using multi-modal cueing, HEP and activity modification. Patient articulated understanding and returned demonstration.  ASSESSMENT Patient presents to clinic with excellent motivation to participate in therapy despite increasd pain (7/10). Patient demonstrates deficits in pain free lumbar motion and posture as evidenced  by R ASIS anterior and inferior shift. Patient able to achieve significant decrease in lumbar paraspinal muscle tension and pain (4/10) during today's session and responded positively to manual interventions in sidelying. Patient will benefit from continued skilled therapeutic intervention to address remaining deficits in pain, ROM, postural strength and coordination in order to decrease episodes of incontinence, increase function, and improve overall QOL.   PT Short Term Goals - 03/04/18 0954      PT SHORT TERM GOAL #1   Title  Pt will be independent with HEP in order to improve lumbar ROM and decrease back pain in order to improve pain-free function at home and work.     Baseline  IE: provided; 03/04/2018: patient able to recall and demonstrate exercises w/o significant reliance on handout    Time  4    Period  Weeks    Status  Achieved    Target Date  02/27/18        PT Long Term Goals - 01/31/18 1108      PT LONG TERM GOAL #1   Title  Patient will demonstrate a clinically significant improvement in function as evidenced by a score on the POPIQ-7 of less than or equal to 59.6% for improved QOL and decreased pelvic symptoms.    Baseline  IE: 85.7%    Time  12    Period  Weeks    Status  New    Target Date  04/25/18      PT LONG TERM GOAL #2   Title  Patient will demonstrate improved PFM function and management of intra-abdominal pressure as evidenced by the ability to cough/sneeze/laugh without leakage 90% of the time for improved QOL and ability to participate fully in activities.    Baseline  IE: sneeze 0%, cough 10%, laugh 80%.     Time  12    Period  Weeks    Status  New    Target Date  04/25/18      PT LONG TERM GOAL #3   Title  Patient will demonstrate low to no pain improved lumbar lateral flexion to the R as evidenced by a measurement of third digit to floor symmetrical with L side and report of <4/10 L sided back pain for improved function at work and in the [employer].      Baseline  IE: R lumbar lateral flexion 61 cm from floor pain 7/10, L lumbar lateral flexion 55cm from floor.    Time  12    Period  Weeks    Status  New    Target Date  04/25/18      PT LONG TERM GOAL #4   Title  Patient will demonstrate proper body mechanics for lifting, bending, squatting to allow for decreased fear of movement as measured by FAB-Q in order to return to PLOF and improved overall QOL.    Baseline  IE: FABQ not administered at this date    Time  55    Period  Weeks    Status  New    Target Date  04/25/18            Plan - 03/04/18 4098    Clinical Impression Statement  Patient presents to clinic with excellent motivation to participate in therapy despite increasd pain (7/10). Patient demonstrates deficits in pain free lumbar motion and posture as evidenced by R ASIS anterior and inferior shift. Patient able to achieve significant decrease in lumbar paraspinal muscle tension and pain (4/10) during today's session and responded positively to manual interventions in sidelying. Patient will benefit from continued skilled therapeutic intervention to address remaining deficits in pain, ROM, postural strength and coordination in order to decrease episodes of incontinence, increase function, and improve overall QOL.    Rehab Potential  Fair    PT Frequency  1x / week    PT Duration  12 weeks    PT Treatment/Interventions  Aquatic Therapy;ADLs/Self Care Home Management;Cryotherapy;Electrical Stimulation;Moist Heat;Biofeedback;Gait training;Stair training;Functional mobility training;Therapeutic activities;Therapeutic exercise;Balance training;Neuromuscular re-education;Patient/family education;Manual techniques;Dry needling;Taping;Joint Manipulations;Spinal Manipulations;Scar mobilization    PT Next Visit Plan  L sided interventions    PT Home Exercise Plan  Access Code: [email or web address]    Consulted and Agree with Plan of Care  Patient       Patient will benefit from  skilled therapeutic intervention in order to improve the following deficits and impairments:  Pain, Improper body mechanics, Decreased coordination, Increased muscle spasms, Postural dysfunction, Decreased strength, Decreased endurance, Decreased activity tolerance, Decreased range of motion, Impaired flexibility, Decreased balance, Decreased mobility, Hypomobility  Visit Diagnosis: Mixed stress and urge urinary incontinence  Muscle weakness (generalized)  Other lack of coordination  Chronic left-sided low back pain, unspecified whether sciatica present  Left lower quadrant abdominal pain  Abnormal posture     Problem List Patient Active Problem List   Diagnosis Date Noted  . Bipolar 1 disorder ([facility]) 10/31/2016  . Chronic low back pain with sciatica 10/31/2016  . Allergic cough 10/31/2016  . Hx of colonic polyps 10/31/2016    Sheria Lang PT, DPT [phone] 03/04/2018, 9:55 AM  [place] [facility] MAIN [facility] SERVICES [address] [place], [place], [address] Phone: [phone]   Fax:  [phone]  Name: Lashiya Penado MRN: [id number] Date of Birth: February 01, 1960

## 2018-03-10 ENCOUNTER — Ambulatory Visit: Payer: No Typology Code available for payment source

## 2018-03-17 ENCOUNTER — Ambulatory Visit: Payer: No Typology Code available for payment source

## 2018-03-25 ENCOUNTER — Encounter: Payer: Self-pay | Admitting: Physical Therapy

## 2018-03-25 ENCOUNTER — Ambulatory Visit: Payer: No Typology Code available for payment source | Attending: Family Medicine | Admitting: Physical Therapy

## 2018-03-25 DIAGNOSIS — R1032 Left lower quadrant pain: Secondary | ICD-10-CM | POA: Insufficient documentation

## 2018-03-25 DIAGNOSIS — R278 Other lack of coordination: Secondary | ICD-10-CM | POA: Diagnosis present

## 2018-03-25 DIAGNOSIS — M545 Low back pain: Secondary | ICD-10-CM | POA: Insufficient documentation

## 2018-03-25 DIAGNOSIS — N3946 Mixed incontinence: Secondary | ICD-10-CM | POA: Insufficient documentation

## 2018-03-25 DIAGNOSIS — R293 Abnormal posture: Secondary | ICD-10-CM | POA: Diagnosis present

## 2018-03-25 DIAGNOSIS — M6281 Muscle weakness (generalized): Secondary | ICD-10-CM | POA: Diagnosis present

## 2018-03-25 DIAGNOSIS — G8929 Other chronic pain: Secondary | ICD-10-CM | POA: Insufficient documentation

## 2018-03-25 NOTE — Therapy (Signed)
[facility] MAIN [facility] SERVICES [address] [place], [place], [address] Phone: [phone]   Fax:  [phone]  Physical Therapy Treatment  Patient Details  Name: Jenna Estrada MRN: [id number] Date of Birth: December 26, 1960 Referring Provider (PT): Bari Edward, MD   Encounter Date: 03/25/2018  PT End of Session - 03/25/18 1458    Visit Number  5    Number of Visits  13    Date for PT Re-Evaluation  04/24/18    Authorization Type  [facility]    Authorization - Visit Number  4    Authorization - Number of Visits  13    PT Start Time  1500    PT Stop Time  1555    PT Time Calculation (min)  55 min    Activity Tolerance  Patient tolerated treatment well    Behavior During Therapy  [facility] for tasks assessed/performed       Past Medical History:  Diagnosis Date  . Anxiety   . Bipolar 1 disorder (HCC)   . Sciatic nerve pain, left    taking gabapentin.    Past Surgical History:  Procedure Laterality Date  . ABDOMINAL HYSTERECTOMY  2008   fibroids, ovaries remain  . APPENDECTOMY     1971  . COLONOSCOPY  10/2014 and 06/17/2015   Every 3 yr due to incomplete prep  . HERNIA REPAIR    . 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 TONSILLECTOMY    . WRIST FRACTURE SURGERY     plate put in right wrist     There were no vitals filed for this visit.  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 Patient has been attending accupuncture and finds it helpful. Patient is noticing more upper back and neck tension since the low back pain has started to resolve. Worst low back pain (4/10) in the past weekend. Patient has continued to eliminate coffee and sugar and finds it helpful. Patient is going to attempt to taper back using gabapentin which she has consulted with her MD about.    Pertinent History  G3, P1 (vaginal delivery, no report of tearing);  abdominal hysterectomy, persistent L sided abdominal pain, fear of movement    Limitations  Lifting;Standing;Walking;Sitting;House hold activities    Patient Stated Goals  Feel better, learn techniques, strengthen the back, reduce night pain.    Currently in Pain?  Yes    Pain Score  5     Pain Location  Back    Pain Orientation  Lower    Pain Descriptors / Indicators  Aching    Pain Type  Chronic pain    Pain Onset  More than a month ago    Multiple Pain Sites  Yes    Pain Score  7    Pain Location  Back    Pain Orientation  Upper    Pain Descriptors / Indicators  Tightness    Pain Type  Chronic pain    Pain Onset  More than a month ago       TREATMENT  Pre-treatment assessment: L PSIS shifted posteriorly in comparison with R. Lumbar flexion and extension both producing pain and limited. LSB 41 degrees, R SB 43 degrees. L quadrant positive for pain. R SLR 70 degrees before pain onset on L. L SLR 51 degrees before onset of L sided  pain and radiation.  Manual Therapy: Gentle distraction/oscillations of RLE at neutral extension and in IR for decreased neural tension and inflammatory response on L side. x8 min Thoracolumbar mobilizations B with open-book movement, grade II/III as tolerated. Performed bilaterally. x6 min each side STM of thoracolumbar region, B, x8 min  Neuromuscular Re-education: Supine posterior pelvic tilt with diaphragmatic breathing for improved pain modulation and graded exposure to lumbar flexion Sidelying open-book thoracolumbar rotations for improved range of motion and decreased tone in postural musculature. Performed with breathing cues and coordination. Sit-to-stand intervals for graded activity after pain reduction (30 sec on: 30 sec off), x3 (reps x11, x13, x6) before pain onset  Post-treatment assessment: L SLR 83 degrees before pain onset, L quadrant non-painful.  Patient educated throughout session on appropriate technique and form using multi-modal  cueing, HEP, and activity modification. Patient articulated understanding and returned demonstration.  Patient Response to interventions: Patient reported decreased pain (3/10) and improved mobility. Patient reports excitement to try open-book exercise as part of HEP for improved postural mobility and decreased midback tension.  ASSESSMENT Patient presents to clinic motivated to participate in therapy. Patient demonstrates deficits in posture, spinal mobility, pain, activity tolerance as evidenced by pain with lumbar AROM, + SLR L, difficulty participating in high effort intervals for greater than 3 min. Patient able to achieve significant pain reduction (3/10) during today's session and responded positively to manual interventions with increased L SLR to 83 degrees from 51 degrees. Patient will benefit from continued skilled therapeutic intervention to address remaining deficits in posture, spinal mobility, pain, activity tolerance in order to decrease incidence of incontinence, increase function, and improve overall QOL.    PT Short Term Goals - 03/04/18 0954      PT SHORT TERM GOAL #1   Title  Pt will be independent with HEP in order to improve lumbar ROM and decrease back pain in order to improve pain-free function at home and work.     Baseline  IE: provided; 03/04/2018: patient able to recall and demonstrate exercises w/o significant reliance on handout    Time  4    Period  Weeks    Status  Achieved    Target Date  02/27/18        PT Long Term Goals - 01/31/18 1108      PT LONG TERM GOAL #1   Title  Patient will demonstrate a clinically significant improvement in function as evidenced by a score on the POPIQ-7 of less than or equal to 59.6% for improved QOL and decreased pelvic symptoms.    Baseline  IE: 85.7%    Time  12    Period  Weeks    Status  New    Target Date  04/25/18      PT LONG TERM GOAL #2   Title  Patient will demonstrate improved PFM function and management of  intra-abdominal pressure as evidenced by the ability to cough/sneeze/laugh without leakage 90% of the time for improved QOL and ability to participate fully in activities.    Baseline  IE: sneeze 0%, cough 10%, laugh 80%.     Time  12    Period  Weeks    Status  New    Target Date  04/25/18      PT LONG TERM GOAL #3   Title  Patient will demonstrate low to no pain improved lumbar lateral flexion to the R as evidenced by a measurement of third digit to floor symmetrical with L side  and report of <4/10 L sided back pain for improved function at work and in the [employer].     Baseline  IE: R lumbar lateral flexion 61 cm from floor pain 7/10, L lumbar lateral flexion 55cm from floor.    Time  12    Period  Weeks    Status  New    Target Date  04/25/18      PT LONG TERM GOAL #4   Title  Patient will demonstrate proper body mechanics for lifting, bending, squatting to allow for decreased fear of movement as measured by FAB-Q in order to return to PLOF and improved overall QOL.    Baseline  IE: FABQ not administered at this date    Time  6312    Period  Weeks    Status  New    Target Date  04/25/18        Plan - 03/25/18 1504    Clinical Impression Statement  Patient presents to clinic motivated to participate in therapy. Patient demonstrates deficits in posture, spinal mobility, pain, activity tolerance as evidenced by pain with lumbar AROM, + SLR L, difficulty participating in high effort intervals for greater than 3 min. Patient able to achieve significant pain reduction (3/10) during today's session and responded positively to manual interventions with increased L SLR to 83 degrees from 51 degrees. Patient will benefit from continued skilled therapeutic intervention to address remaining deficits in posture, spinal mobility, pain, activity tolerance in order to decrease incidence of incontinence, increase function, and improve overall QOL.    Rehab Potential  Fair    PT Frequency  1x / week     PT Duration  12 weeks    PT Treatment/Interventions  Aquatic Therapy;ADLs/Self Care Home Management;Cryotherapy;Electrical Stimulation;Moist Heat;Biofeedback;Gait training;Stair training;Functional mobility training;Therapeutic activities;Therapeutic exercise;Balance training;Neuromuscular re-education;Patient/family education;Manual techniques;Dry needling;Taping;Joint Manipulations;Spinal Manipulations;Scar mobilization    PT Next Visit Plan  L sided interventions    PT Home Exercise Plan  Access Code: [email or web address], [email or web address]    Consulted and Agree with Plan of Care  Patient       Patient will benefit from skilled therapeutic intervention in order to improve the following deficits and impairments:  Pain, Improper body mechanics, Decreased coordination, Increased muscle spasms, Postural dysfunction, Decreased strength, Decreased endurance, Decreased activity tolerance, Decreased range of motion, Impaired flexibility, Decreased balance, Decreased mobility, Hypomobility  Visit Diagnosis: Mixed stress and urge urinary incontinence  Muscle weakness (generalized)  Other lack of coordination  Chronic left-sided low back pain, unspecified whether sciatica present  Left lower quadrant abdominal pain  Abnormal posture     Problem List Patient Active Problem List   Diagnosis Date Noted  . Bipolar 1 disorder (HCC) 10/31/2016  . Chronic low back pain with sciatica 10/31/2016  . Allergic cough 10/31/2016  . Hx of colonic polyps 10/31/2016   Sheria LangKatlin Elianna Windom PT, DPT [phone] 03/26/2018, 10:08 AM  [facility] [address] [place], [place], [address] Phone: [phone]   Fax:  [phone]  Name: Jenna Estrada MRN: [id number] Date of Birth: 03/15/1960

## 2018-04-02 ENCOUNTER — Ambulatory Visit: Payer: No Typology Code available for payment source

## 2018-04-25 ENCOUNTER — Encounter: Payer: Self-pay | Admitting: Physical Therapy

## 2018-04-25 DIAGNOSIS — R1032 Left lower quadrant pain: Secondary | ICD-10-CM

## 2018-04-25 DIAGNOSIS — M545 Low back pain: Secondary | ICD-10-CM

## 2018-04-25 DIAGNOSIS — N3946 Mixed incontinence: Secondary | ICD-10-CM

## 2018-04-25 DIAGNOSIS — G8929 Other chronic pain: Secondary | ICD-10-CM

## 2018-04-25 DIAGNOSIS — M6281 Muscle weakness (generalized): Secondary | ICD-10-CM

## 2018-04-25 DIAGNOSIS — R293 Abnormal posture: Secondary | ICD-10-CM

## 2018-04-25 DIAGNOSIS — R278 Other lack of coordination: Secondary | ICD-10-CM

## 2018-04-25 NOTE — Therapy (Signed)
[place] MAIN [facility] SERVICES [address] [place], [place], [address] Phone: [phone]   Fax:  [phone]  April 25, 2018    Physical Therapy Discharge Summary  Patient: Jenna Estrada  MRN: [id number]  Date of Birth: Dec 28, 1960   Diagnosis: Mixed stress and urge urinary incontinence  Muscle weakness (generalized)  Other lack of coordination  Chronic left-sided low back pain, unspecified whether sciatica present  Left lower quadrant abdominal pain  Abnormal posture   Referring Provider (PT): Halina Maidens, MD   The above patient had been seen in Physical Therapy 5 times of 13 treatments scheduled with 0 no shows and 8 cancellations.  The treatment consisted of manual therapy, therapeutic exercise, neuromuscular re-education, patient education, and self-care/home management interventions.  The patient is: Improved  Subjective: Patient left voicemail at clinic stating that she was doing great and did not need any more treatments.   Discharge Findings: DPT unable to assess 2/2 to self-charge via telephone communication. At last visit, patient was able to achieve an L SLR of 83 degrees which was a 32 degree improvement from her baseline. Patient also demonstrated improved body/postural awareness as well as strategies for pain modulation (diaphragmatic breathing, scheduling pleasing activity, graded activity).  Functional Status at Discharge: Independent  Goals Partially Met    Sincerely,  Myles Gip PT, DPT [phone]   [place] MAIN [facility] SERVICES [place], [place], [address] Phone: [phone]   Fax:  [phone]  Patient: Jenna Estrada  MRN: [id number]  Date of Birth: 06-Mar-1960

## 2018-05-29 ENCOUNTER — Encounter: Payer: 59 | Admitting: Internal Medicine

## 2020-10-27 IMAGING — MG DIGITAL SCREENING BILATERAL MAMMOGRAM WITH TOMO AND CAD
8 series · 8 of 24 positions shown · non-contrast
Comparison: Previous exam(s).

CLINICAL DATA: Screening.

EXAM:
DIGITAL SCREENING BILATERAL MAMMOGRAM WITH TOMO AND CAD

[R MLO synth-2D]
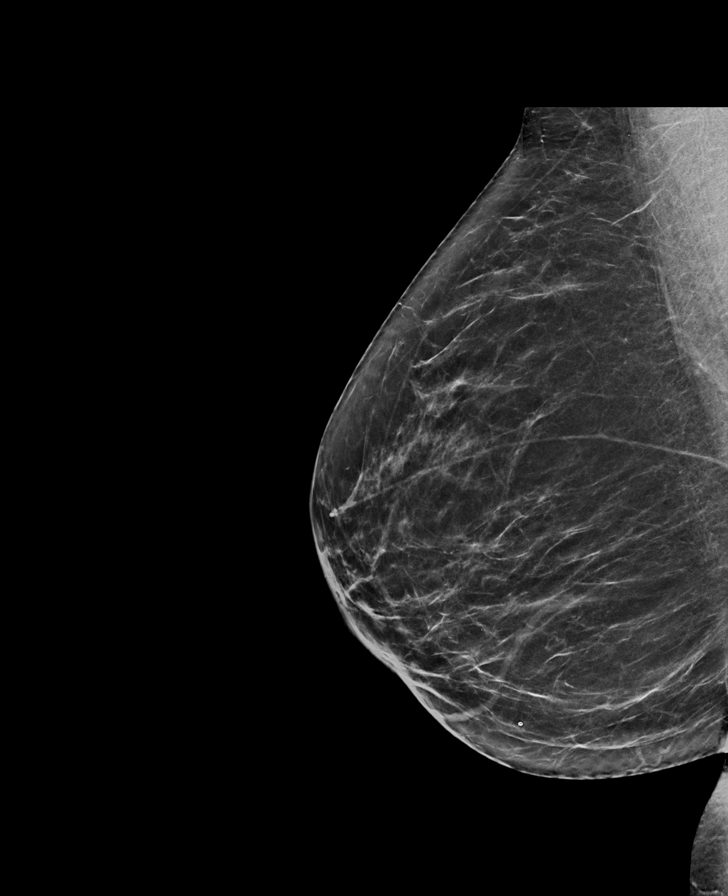

[L MLO synth-2D]
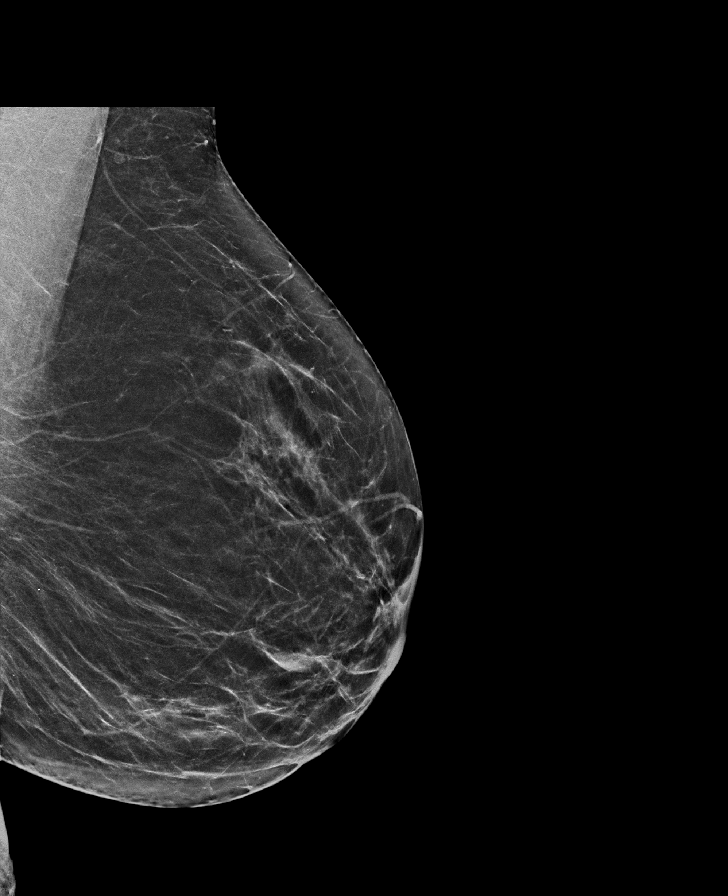

[R CC synth-2D]
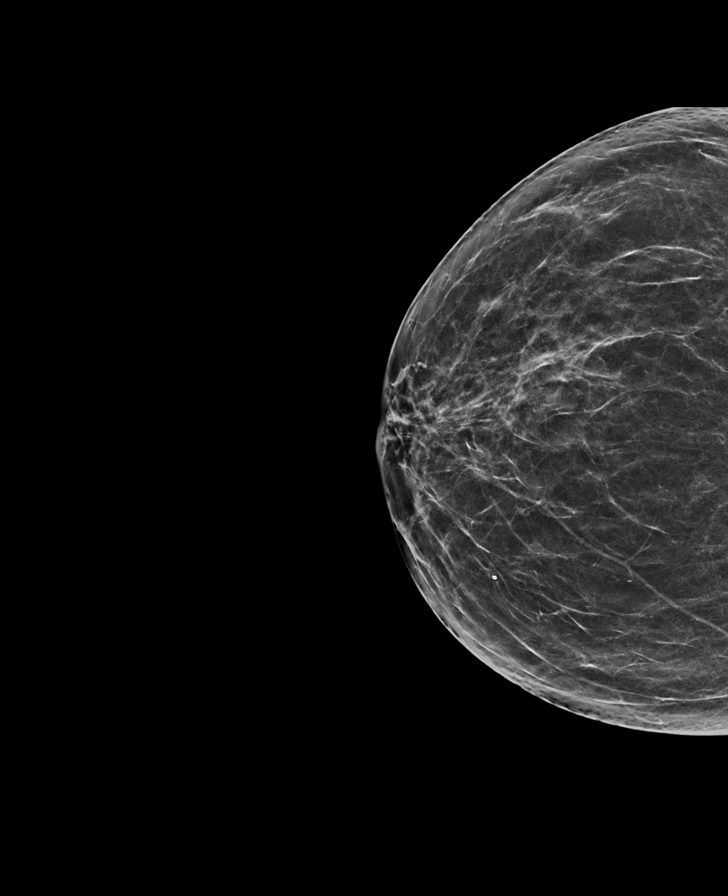

[L CC synth-2D]
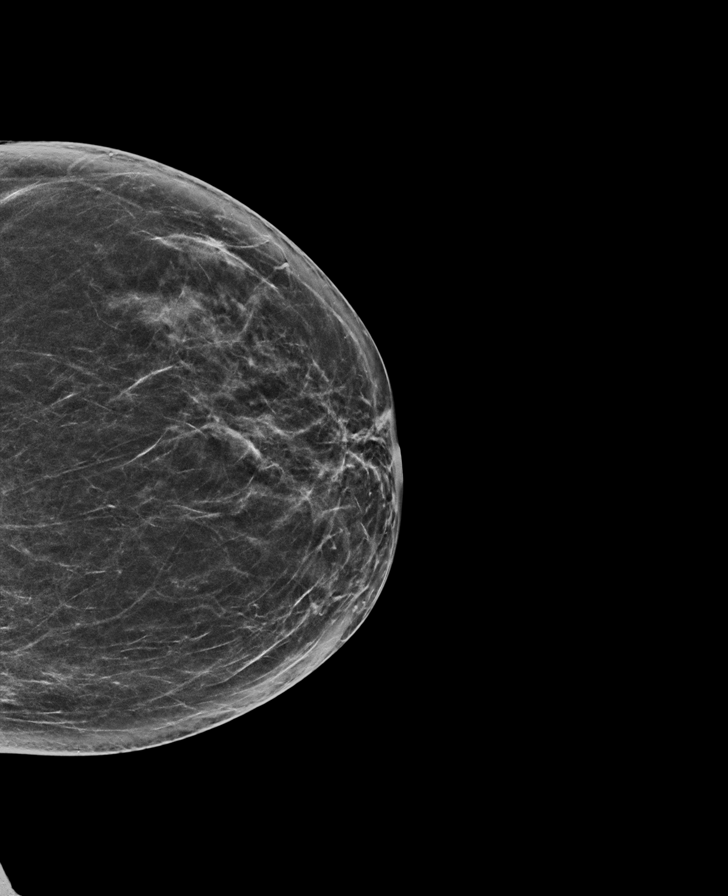

[R CC tomo · tomo slice 31/60.0]
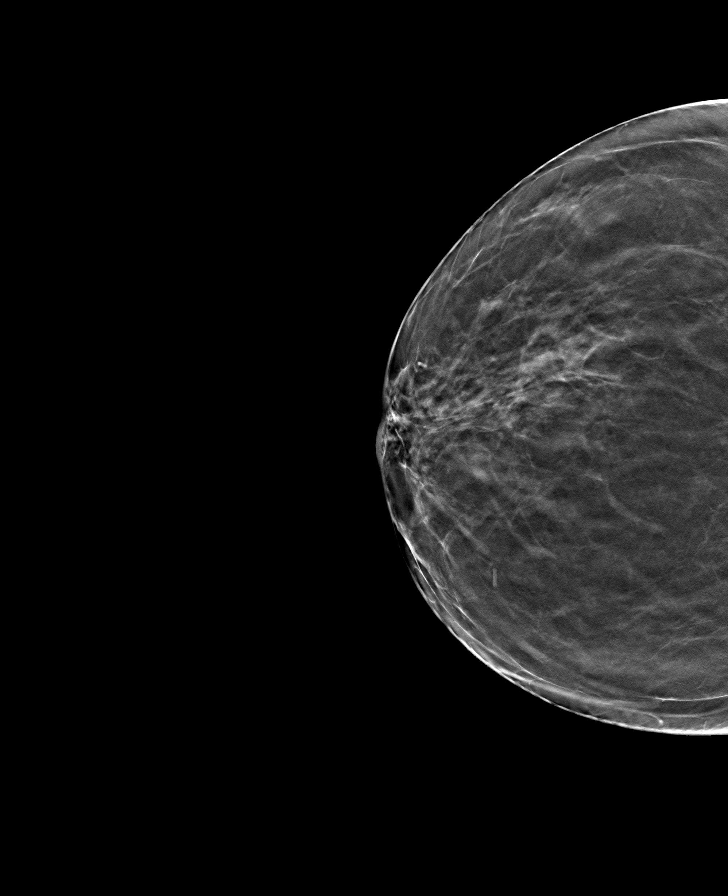

[L MLO tomo · tomo slice 34/67.0]
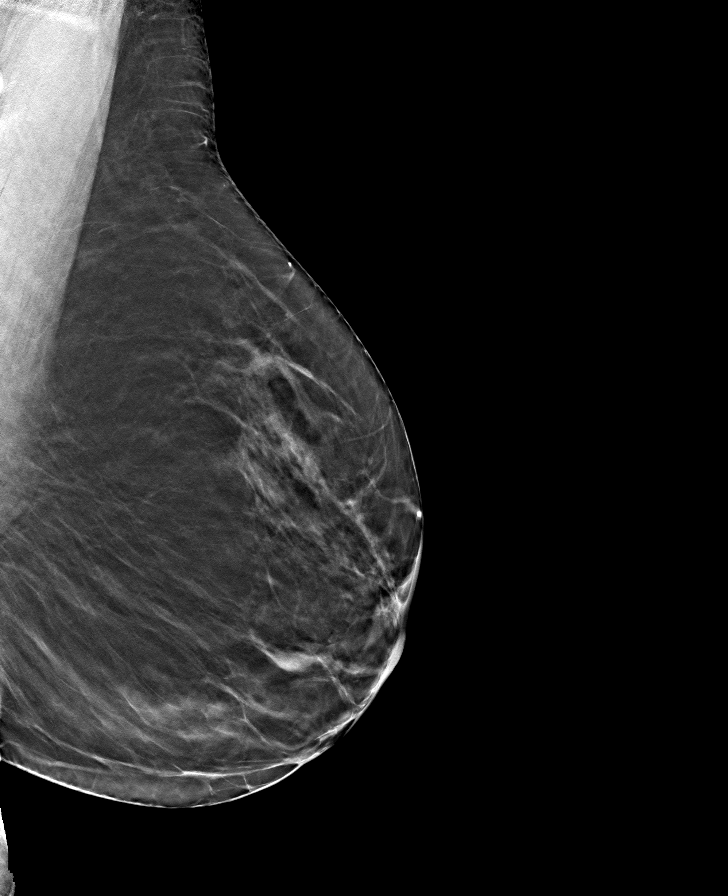

[L CC tomo · tomo slice 33/64.0]
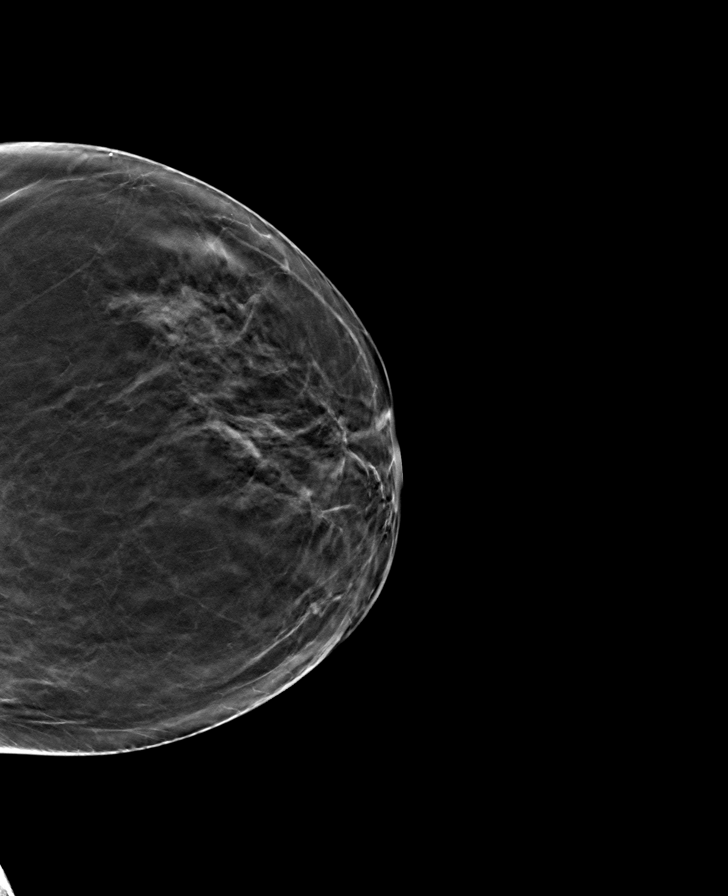

[R MLO tomo · tomo slice 34/67.0]
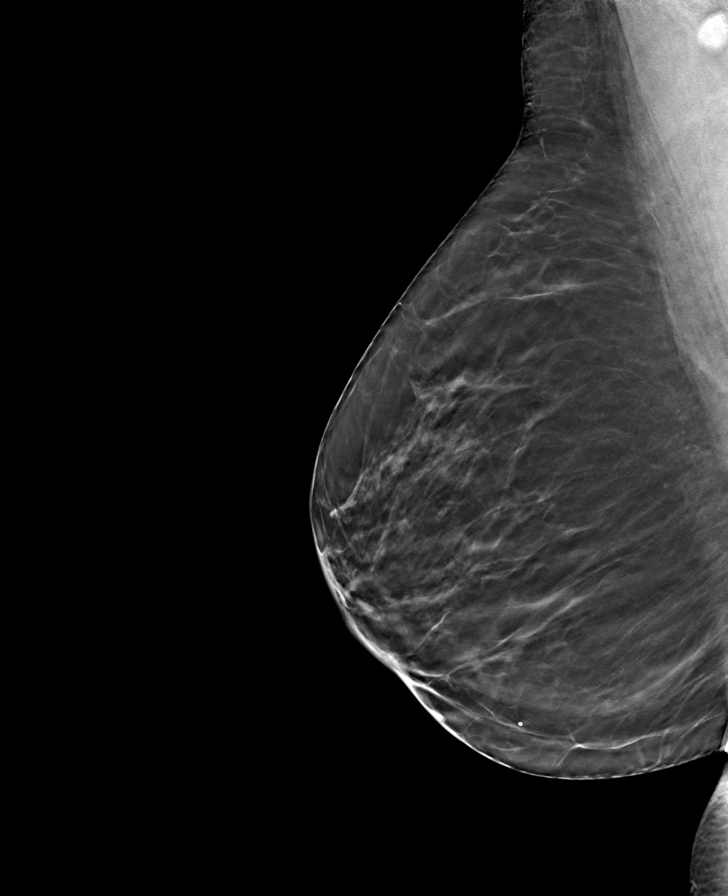

[8 of 24 positions shown; findings below may reference images not displayed]

ACR Breast Density Category b: There are scattered areas of
fibroglandular density.
FINDINGS: There are no findings suspicious for malignancy. Images were
processed with CAD.
IMPRESSION: No mammographic evidence of malignancy. A result letter of this
screening mammogram will be mailed directly to the patient.

RECOMMENDATION:
Screening mammogram in one year. (Code:[id number])

BI-RADS CATEGORY  1: Negative.
